# Patient Record
Sex: Male | Born: 1970
Health system: Northeastern US, Community
[De-identification: ages and names within clinical notes are randomized; demographics above are authoritative.]

## PROBLEM LIST (undated history)

## (undated) DIAGNOSIS — E785 Hyperlipidemia, unspecified: Secondary | ICD-10-CM

## (undated) HISTORY — DX: Hyperlipidemia, unspecified: E78.5

---

## 2002-08-07 ENCOUNTER — Encounter: Payer: Self-pay | Admitting: Family Medicine

## 2003-02-26 ENCOUNTER — Ambulatory Visit: Payer: Self-pay | Admitting: Family Medicine

## 2003-02-26 ENCOUNTER — Ambulatory Visit (HOSPITAL_BASED_OUTPATIENT_CLINIC_OR_DEPARTMENT_OTHER): Payer: Self-pay | Admitting: Family Medicine

## 2003-03-26 ENCOUNTER — Ambulatory Visit (HOSPITAL_BASED_OUTPATIENT_CLINIC_OR_DEPARTMENT_OTHER): Payer: Self-pay | Admitting: Family Medicine

## 2003-03-27 ENCOUNTER — Ambulatory Visit: Payer: Self-pay | Admitting: Family Medicine

## 2003-04-16 ENCOUNTER — Ambulatory Visit: Payer: Self-pay | Admitting: Family Medicine

## 2003-04-30 ENCOUNTER — Encounter: Payer: Self-pay | Admitting: Family Medicine

## 2003-05-26 ENCOUNTER — Ambulatory Visit: Payer: Self-pay | Admitting: Family Medicine

## 2003-05-26 LAB — URINALYSIS
BILIRUBIN, URINE: NEGATIVE
GLUCOSE, URINE: NEGATIVE MG/DL
KETONE, URINE: NEGATIVE MG/DL
LEUKOCYTE ESTERASE: NEGATIVE
NITRITE, URINE: NEGATIVE
OCCULT BLOOD, URINE: NEGATIVE
PH URINE: 6.5 (ref 5.0–8.0)
PROTEIN, URINE: NEGATIVE MG/DL
SPECIFIC GRAVITY URINE: 1.01 (ref 1.003–1.035)

## 2003-05-26 LAB — CHG LIPID PANEL
Cholesterol: 240 mg/dl — ABNORMAL HIGH (ref 0–200)
HIGH DENSITY LIPOPROTEIN: 42 mg/dl (ref 35–95)
LOW DENSITY LIPOPROTEIN DIRECT: 185 mg/dl — ABNORMAL HIGH (ref ?–130)
RISK FACTOR: 5.7 — ABNORMAL HIGH (ref ?–5.0)
TRIGLYCERIDES: 87 mg/dl (ref 30–200)

## 2003-05-26 LAB — TRANSFERASE ALANINE AMINO ALT SGPT: ALANINE AMINOTRANSFERASE: 18 IU/L (ref 3–36)

## 2003-05-26 LAB — GLUCOSE RANDOM: Glucose Random: 90 mg/dl (ref 65–160)

## 2003-05-27 LAB — RPR: RPR: NONREACTIVE

## 2003-05-28 ENCOUNTER — Ambulatory Visit: Payer: Self-pay | Admitting: Family Medicine

## 2003-05-28 LAB — HEPATITIS B SURFACE ANTIBODY: HEPATITIS B SURFACE ANTIBODY: NEGATIVE

## 2003-06-01 ENCOUNTER — Ambulatory Visit (HOSPITAL_BASED_OUTPATIENT_CLINIC_OR_DEPARTMENT_OTHER): Payer: Self-pay | Admitting: Otolaryngology

## 2003-06-08 ENCOUNTER — Other Ambulatory Visit: Payer: Self-pay | Admitting: Otolaryngology

## 2003-06-14 LAB — MRI INTERNAL AUDITORY CANAL W & WO CONTRAST

## 2003-07-01 ENCOUNTER — Encounter: Payer: Self-pay | Admitting: Family Medicine

## 2003-07-02 ENCOUNTER — Ambulatory Visit: Payer: Self-pay | Admitting: Family Medicine

## 2003-07-06 ENCOUNTER — Encounter (HOSPITAL_BASED_OUTPATIENT_CLINIC_OR_DEPARTMENT_OTHER): Payer: Self-pay

## 2003-07-06 ENCOUNTER — Ambulatory Visit (HOSPITAL_BASED_OUTPATIENT_CLINIC_OR_DEPARTMENT_OTHER): Payer: Self-pay | Admitting: Otolaryngology

## 2003-07-08 ENCOUNTER — Ambulatory Visit: Payer: Self-pay | Admitting: Family Medicine

## 2003-07-27 ENCOUNTER — Encounter (HOSPITAL_BASED_OUTPATIENT_CLINIC_OR_DEPARTMENT_OTHER): Payer: PRIVATE HEALTH INSURANCE | Admitting: Neurology

## 2003-07-27 LAB — CHG SEDIMENTATION RATE RBC NON-AUTOMATED: RBC SEDIMENTATION RATE: 2 MM/HR (ref 0–10)

## 2003-07-28 LAB — RPR: RPR: NONREACTIVE

## 2003-07-28 LAB — CHG RHEUMATOID FACTOR QUALITATIVE: RHEUMATOID FACTOR: POSITIVE

## 2003-07-30 LAB — LYME ANTIBODY, TOTAL(SENDOUT): LYME ANTIBODY, TOTAL: NEGATIVE

## 2003-08-04 LAB — ANTINUCLEAR ANTIBODY: ANTINUCLEAR ANTIBODY: NEGATIVE

## 2003-08-17 ENCOUNTER — Encounter (HOSPITAL_BASED_OUTPATIENT_CLINIC_OR_DEPARTMENT_OTHER): Payer: Self-pay | Admitting: Neurology

## 2003-08-17 ENCOUNTER — Other Ambulatory Visit (HOSPITAL_BASED_OUTPATIENT_CLINIC_OR_DEPARTMENT_OTHER): Payer: Self-pay | Admitting: Neurology

## 2003-08-17 DIAGNOSIS — G379 Demyelinating disease of central nervous system, unspecified: Secondary | ICD-10-CM

## 2003-08-24 ENCOUNTER — Encounter (HOSPITAL_BASED_OUTPATIENT_CLINIC_OR_DEPARTMENT_OTHER): Payer: PRIVATE HEALTH INSURANCE | Admitting: Neurology

## 2003-08-24 ENCOUNTER — Ambulatory Visit (HOSPITAL_BASED_OUTPATIENT_CLINIC_OR_DEPARTMENT_OTHER): Payer: Self-pay | Admitting: Ophthalmology

## 2003-08-24 LAB — ~~LOC~~ MEDICAL SPECIALITIES

## 2003-08-25 LAB — CHG RHEUMATOID FACTOR QUALITATIVE: RHEUMATOID FACTOR: NEGATIVE

## 2003-08-27 LAB — WHIDDEN-PULMONARY FUNCTION

## 2003-08-28 LAB — WHIDDEN-EEG

## 2003-09-07 ENCOUNTER — Encounter (HOSPITAL_BASED_OUTPATIENT_CLINIC_OR_DEPARTMENT_OTHER): Payer: Self-pay | Admitting: Neurology

## 2003-09-14 ENCOUNTER — Encounter (HOSPITAL_BASED_OUTPATIENT_CLINIC_OR_DEPARTMENT_OTHER): Payer: Self-pay | Admitting: Neurology

## 2003-09-14 LAB — CELL COUNT & DIFFERENTIAL CSF
CSF MONONUCLEATED CELL PERCENT: 0 %
CSF MONONUCLEATED CELL PERCENT: 100 %
CSF OTHER CELLS %: 0 %
CSF OTHER CELLS %: 0 %
CSF POLYNUCLEATED CELL PERCENT: 0 %
CSF POLYNUCLEATED CELL PERCENT: 0 %
CSF RED BLOOD CELLS: 0 /mm3 (ref 0–0)
CSF RED BLOOD CELLS: 1 /mm3 — ABNORMAL HIGH (ref 0–0)
CSF TUBE NUMBER: 1
CSF TUBE NUMBER: 5
CSF WHITE BLOOD CELLS: 0 /mm3 (ref 0–5)
CSF WHITE BLOOD CELLS: 3 /mm3 (ref 0–5)

## 2003-09-14 LAB — CYTOLOGY SPECIMEN

## 2003-09-14 LAB — ~~LOC~~ MEDICAL SPECIALITIES

## 2003-09-14 LAB — GLUCOSE RANDOM: Glucose Random: 94 mg/dl (ref 65–160)

## 2003-09-15 LAB — CRYPTOCOCCAL ANTIGEN, CSF: CRYPTOCOCCAL ANTIGEN CSF: NEGATIVE

## 2003-09-15 LAB — VDRL CSF: VDRL CSF: NONREACTIVE

## 2003-09-21 LAB — CSF CULTURE AND GRAM STAIN: CSF CULTURE: NO GROWTH

## 2003-09-23 LAB — GLUCOSE AND PROTEIN CSF
GLUCOSE CSF: 55 mg/dl
PROTEIN CSF: 27 mg/dl (ref 15–45)

## 2003-09-23 LAB — OLIGOCLONAL IMMUNE: OLIGOCLONAL BANDS, CSF: NEGATIVE

## 2003-09-28 ENCOUNTER — Encounter (HOSPITAL_BASED_OUTPATIENT_CLINIC_OR_DEPARTMENT_OTHER): Payer: Charity | Admitting: Neurology

## 2003-09-28 LAB — ~~LOC~~ MEDICAL SPECIALITIES

## 2003-10-21 LAB — FUNGAL CULTURE

## 2003-11-17 HISTORY — PX: EXCISION/TRANSPOSITION PTERYGIUM W/O GRAFT: OPH372

## 2003-11-20 LAB — TB CULT AND SMEAR (STATE LAB): TB SMEAR STATE LAB: NONE SEEN

## 2003-12-09 LAB — HEMATOCRIT: HEMATOCRIT: 44.9 % (ref 42.0–52.0)

## 2003-12-10 ENCOUNTER — Ambulatory Visit: Payer: Self-pay | Admitting: Family Medicine

## 2003-12-10 ENCOUNTER — Encounter (HOSPITAL_BASED_OUTPATIENT_CLINIC_OR_DEPARTMENT_OTHER): Payer: Self-pay | Admitting: Family Medicine

## 2003-12-21 ENCOUNTER — Encounter (HOSPITAL_BASED_OUTPATIENT_CLINIC_OR_DEPARTMENT_OTHER): Payer: Self-pay | Admitting: Ophthalmology

## 2003-12-22 ENCOUNTER — Ambulatory Visit (HOSPITAL_BASED_OUTPATIENT_CLINIC_OR_DEPARTMENT_OTHER): Payer: Self-pay | Admitting: Ophthalmology

## 2003-12-22 LAB — ~~LOC~~-OPERATIVE REPORT

## 2003-12-25 ENCOUNTER — Ambulatory Visit (HOSPITAL_BASED_OUTPATIENT_CLINIC_OR_DEPARTMENT_OTHER): Payer: Self-pay | Admitting: Family Medicine

## 2003-12-29 ENCOUNTER — Ambulatory Visit (HOSPITAL_BASED_OUTPATIENT_CLINIC_OR_DEPARTMENT_OTHER): Payer: Self-pay | Admitting: Ophthalmology

## 2004-01-18 ENCOUNTER — Ambulatory Visit (HOSPITAL_BASED_OUTPATIENT_CLINIC_OR_DEPARTMENT_OTHER): Payer: Self-pay | Admitting: Ophthalmology

## 2004-01-25 ENCOUNTER — Encounter (HOSPITAL_BASED_OUTPATIENT_CLINIC_OR_DEPARTMENT_OTHER): Payer: Charity | Admitting: Neurology

## 2004-01-25 LAB — ~~LOC~~ MEDICAL SPECIALITIES

## 2006-01-16 ENCOUNTER — Ambulatory Visit (HOSPITAL_BASED_OUTPATIENT_CLINIC_OR_DEPARTMENT_OTHER): Payer: Self-pay | Admitting: Family Medicine

## 2006-01-16 ENCOUNTER — Encounter (HOSPITAL_BASED_OUTPATIENT_CLINIC_OR_DEPARTMENT_OTHER): Payer: Self-pay | Admitting: Family Medicine

## 2006-01-16 VITALS — BP 120/90 | HR 68 | Temp 98.1°F | Resp 18 | Ht 65.0 in | Wt 131.0 lb

## 2006-01-16 DIAGNOSIS — J309 Allergic rhinitis, unspecified: Secondary | ICD-10-CM

## 2006-01-16 DIAGNOSIS — Z823 Family history of stroke: Secondary | ICD-10-CM | POA: Insufficient documentation

## 2006-01-16 DIAGNOSIS — Z0000 Encounter for general adult medical examination without abnormal findings: Secondary | ICD-10-CM

## 2006-01-16 DIAGNOSIS — R51 Headache: Secondary | ICD-10-CM

## 2006-01-16 DIAGNOSIS — R519 Headache, unspecified: Secondary | ICD-10-CM | POA: Insufficient documentation

## 2006-01-16 DIAGNOSIS — E785 Hyperlipidemia, unspecified: Secondary | ICD-10-CM

## 2006-01-16 MED ORDER — LORATADINE 10 MG PO TABS
ORAL_TABLET | ORAL | Status: AC
Start: 2006-01-16 — End: 2007-01-16

## 2006-01-16 NOTE — Progress Notes (Signed)
Patient Active Problem List:   HEADACHE [784.0]   Date Noted: 01/16/2006   Comment: 01/16/2006 left sided, ?migraine type; try    Excedrin Migraine PRN   HYPERLIPIDEMIA NEC/NOS [272.4]   Date Noted: 01/16/2006   Comment: 01/16/2006 Diet info given in appropriate    language; will recheck    ALLERGIC RHINITIS NOS [477.9]   Date Noted: 01/16/2006   Comment: 01/16/2006 sx dry throat, try PRN    loratadine and diet info given for GERD   FAMILY HX STROKE [V17.1]   Date Noted: 01/16/2006   Comment: Father 50  No current outpatient prescriptions on file prior to 01/16/06.    Here to follow up above.  Patient Active Problem List updated at today's visit.      Sx left eye for past 3-4 mos; first gets HA, then gets "lights, then clouding" over left eye. Happens about q 1-2 weeks; tried acetaminophen (Tylenol).  Also sx both legs get numbness after same position for prolonged period; no other neuro sx; The patient is reassured that these symptoms do not appear to represent a serious or threatening condition .  Also sx past yr of throat dryness; increased with soda, ?caffeine.no other ENT/GI sx.    Otherwise Generally feels well; no chest pain, no shortness of breath, no DOE, no fever, no significant GI sx, no focal neuro sx, no significant/worrisome rash, no significant GU sx, no acute or significant musculoskeletal sx    OBJECTIVE:  BP 120/90  Pulse 68  Temp (Src) 98.1 (Oral)  Resp 18  Ht 5\' 5"  (1.80m)  Wt 131 lbs (59.4kg)  Body mass index is 21.80 kg/(m^2).  NAD, alert, well developed, well nourished  Patient appears well. Afebrile. Nose: nasal pallor and congestion noted. Ears are normal. Throat unremarkable except post nasal drip, no exudates. Neck without significant lymphadenopathy. Chest is clear to IPPA.         SUBJECTIVE: 35 year old male for annual routine checkup.   I have fully reviewed the past medical, surgical, social and family history and updated the Histories section of EpicCare.    No current outpatient  prescriptions on file prior to 01/16/06.     Allergies: Review of patient's allergies indicates no known allergies.     ROS: No TIA's or unusual headaches, no dysphagia. No prolonged   cough. No dyspnea or chest pain on exertion. No abdominal pain,   change in bowel habits, black or bloody stools. No urinary tract   symptoms.    OBJECTIVE:   HEAD AND NECK: Ears normal. Throat, oral cavity and tongue normal. Neck supple. No adenopathy or masses in the neck or   supraclavicular regions. No carotid bruits. No thyromegaly.   NEURO: Cranial nerves are normal. Neck supple. DTR's normal and symmetric.   CHEST: Clear, good air entry, no wheezes, rhonci or rales.   HEART: S1 and S2 normal, no murmurs, clicks, gallops or   rubs. Regular rate and rhythm. No edema or JVD.   ABDOMEN: Soft without tenderness, guarding, mass or organomegaly. No CVA tenderness or inguinal adenopathy.   EXTREMITIES: Extremities, reflexes and peripheral pulses are normal.   SKIN: No rashes or suspicious skin lesions noted.   Genitals normal; both testes normal without tenderness, masses, hydroceles, varicoceles, erythema or swelling. Shaft normal, meatus normal without discharge. No inguinal hernia noted. No inguinal lymphadenopathy.    ASSESSMENT:   V70.0 ROUTINE MEDICAL EXAM (primary encounter diagnosis)  Note:   Plan: GLUCOSE FASTING  784.0 HEADACHE  Note: as per Patient Active Problem List; Call or return to our office if these symptoms worsen or fail to improve as expected.   Plan:     272.4 HYPERLIPIDEMIA NEC/NOS  Note: will recheck   Plan: LIPID PANEL, ROUTINE VENIPUNCTURE   counselled regarding need to lower cholesterol to avoid complications such as MI, CVA, and even death.     477.9 ALLERGIC RHINITIS NOS  Note: See Orders and Patient Instructions.   Plan: LORATADINE 10 MG OR TABS       V17.1 FAMILY HX STROKE  Note: counselled regarding risk factors for CAD, including diabetes, hypercholesterolemia, hypertension, smoking, family history,  obesity, sedentary lifestyle   Plan: GLUCOSE FASTING

## 2006-01-16 NOTE — Patient Instructions (Signed)
Dieta, exercicio como falamos.  Tome para dor de cabeca, quando precisar: Excedrin Migraine.  Se continuar com sintomas na garganta, tome loratadine.  Volte para fazer exames de sangue, em jejum (nada de comer ou beber depois da meia noite; mas pode tomar agua e os remedios).

## 2006-01-22 ENCOUNTER — Other Ambulatory Visit (HOSPITAL_BASED_OUTPATIENT_CLINIC_OR_DEPARTMENT_OTHER): Payer: Charity | Admitting: Lab

## 2006-01-22 DIAGNOSIS — E785 Hyperlipidemia, unspecified: Secondary | ICD-10-CM

## 2006-01-22 DIAGNOSIS — Z823 Family history of stroke: Secondary | ICD-10-CM

## 2006-01-22 DIAGNOSIS — Z0000 Encounter for general adult medical examination without abnormal findings: Principal | ICD-10-CM

## 2006-01-22 LAB — CHG LIPID PANEL
Cholesterol: 210 mg/dl — ABNORMAL HIGH (ref 0–200)
HIGH DENSITY LIPOPROTEIN: 37 mg/dl (ref 29–71)
LOW DENSITY LIPOPROTEIN DIRECT: 160 mg/dl — ABNORMAL HIGH (ref 0–100)
RISK FACTOR: 5.7 — ABNORMAL HIGH (ref ?–5.0)
TRIGLYCERIDES: 55 mg/dl (ref 0–150)

## 2006-01-22 LAB — GLUCOSE FASTING: GLUCOSE FASTING: 90 mg/dl (ref 74–118)

## 2006-01-22 NOTE — Nursing Note (Signed)
>>   Jonathan [name], Jonathan [name]     01/22/2006   8:47 am  Labs drawn.

## 2006-03-15 ENCOUNTER — Encounter (HOSPITAL_BASED_OUTPATIENT_CLINIC_OR_DEPARTMENT_OTHER): Payer: Self-pay | Admitting: Family Medicine

## 2006-05-29 ENCOUNTER — Ambulatory Visit (HOSPITAL_BASED_OUTPATIENT_CLINIC_OR_DEPARTMENT_OTHER): Payer: Charity | Admitting: Family Medicine

## 2006-05-29 VITALS — BP 110/74 | HR 80 | Ht 65.0 in | Wt 127.0 lb

## 2006-05-29 DIAGNOSIS — H11009 Unspecified pterygium of unspecified eye: Secondary | ICD-10-CM

## 2006-05-29 DIAGNOSIS — Z823 Family history of stroke: Secondary | ICD-10-CM

## 2006-05-29 DIAGNOSIS — E785 Hyperlipidemia, unspecified: Secondary | ICD-10-CM

## 2006-05-29 MED ORDER — ASPIRIN 81 MG PO TABS
ORAL_TABLET | ORAL | Status: DC
Start: 2006-05-29 — End: 2016-01-17

## 2006-05-29 NOTE — Patient Instructions (Signed)
Dieta, exercicio como falamos.  Consulta com Ophthalmology.  Tome aspirina todos os dias, ou 3-5 dias por semana.  Volte para um exame completo, em jejum (nada de comer ou beber depois da meia noite; mas pode tomar agua e os remedios), em Maio 2008.

## 2006-05-29 NOTE — Progress Notes (Signed)
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    Patient Active Problem List:   HEADACHE [784.0]   Date Noted: 01/16/2006   Comment: 01/16/2006 left sided, ?migraine type; try    Excedrin Migraine PRN   HYPERLIPIDEMIA NEC/NOS [272.4]   Date Noted: 01/16/2006   Comment: 01/16/2006 Diet info given in appropriate    language; will recheck    ALLERGIC RHINITIS NOS [477.9]   Date Noted: 01/16/2006   Comment: 01/16/2006 sx dry throat, try PRN loratadine    and diet info given for GERD   FAMILY HX STROKE [V17.1]   Date Noted: 01/16/2006   Comment: Father 52    Generally feels well; no chest pain, no shortness of breath, no DOE, no fever, no significant GI sx, no focal neuro sx, no significant/worrisome rash, no significant GU sx, no acute or significant musculoskeletal sx    OBJECTIVE:  \BP 110/74  Pulse 80  Ht 5\' 5"  (1.75m)  Wt 127 lbs (57.6kg)  Body mass index is 21.13 kg/(m^2).  Eyes: Pterygium noted bilat; no atypical features noted    ASSESSMENT:  272.4 HYPERLIPIDEMIA NEC/NOS (primary encounter diagnosis)  Note: counselled regarding need to lower cholesterol to avoid complications such as MI, CVA, and even death.; options discussed in detail; will start asa for now  Plan: ASPIRIN 81 MG OR TABS       V17.1 FAMILY HX STROKE  Note: counselled regarding risk factors for CAD and stroke, including diabetes, hypercholesterolemia, hypertension, smoking, family history, obesity, sedentary lifestyle   Plan: ASPIRIN 81 MG OR TABS       372.40 PTERYGIUM NOS  Note: See Orders and Patient Instructions.   Plan: REFERRAL TO OPHTHALMOLOGY (INT)

## 2006-07-12 ENCOUNTER — Other Ambulatory Visit: Payer: Charity | Admitting: Ophthalmology

## 2006-11-07 ENCOUNTER — Ambulatory Visit (HOSPITAL_BASED_OUTPATIENT_CLINIC_OR_DEPARTMENT_OTHER): Payer: No Typology Code available for payment source

## 2006-11-07 VITALS — BP 118/80 | HR 74 | Temp 98.3°F | Ht 65.5 in | Wt 128.1 lb

## 2006-11-07 DIAGNOSIS — R51 Headache: Secondary | ICD-10-CM

## 2006-11-07 MED ORDER — NAPROXEN 375 MG PO TABS
ORAL_TABLET | ORAL | Status: AC
Start: 2006-11-07 — End: 2006-12-06

## 2006-11-07 MED ORDER — MULTIVITAMINS FC OR TABS
ORAL_TABLET | ORAL | Status: AC
Start: 2006-11-07 — End: 2006-12-06

## 2006-11-07 NOTE — Patient Instructions (Signed)
4 weeks follow up phyical with PCP.

## 2006-11-07 NOTE — Progress Notes (Signed)
SUBJECTIVE:  Jonathan [name] is a 36 year old male who complains of headaches for 4 days. Description of pain: bilateral in the temporal area. Duration of individual headaches: 3 hours, frequency rare.   Associated symptoms: muscle aches and tightness. No assoc. Photo/phonophobia, n/v.   Pain relief adequate with two acetaminophen. Precipitating factors: stress and work. He denies a history of recent head injury.   Prior neurological history: negative for no neurological problems.  Neurologic Review of Systems - no TIA or stroke-like symptoms, no amaurosis, diplopia, abnormal speech, unilateral numbness or weakness, stable neurological symptoms unchanged from prior visits.    States that with elevated cholesterol, he would like to exercise but has not started. He has been working and coming home to sleep. Eating better though.     Patient Active Problem List:   HEADACHE [784.0]   Date Noted: 01/16/2006   Comment: 01/16/2006 left sided, ?migraine type; try    Excedrin Migraine PRN   HYPERLIPIDEMIA NEC/NOS [272.4]   Date Noted: 01/16/2006   Comment: 01/16/2006 Diet info given in appropriate    language; will recheck    ALLERGIC RHINITIS NOS [477.9]   Date Noted: 01/16/2006   Comment: 01/16/2006 sx dry throat, try PRN loratadine    and diet info given for GERD   FAMILY HX STROKE [V17.1]   Date Noted: 01/16/2006   Comment: Father 45      Current outpatient prescriptions:  NAPROXEN 375 MG OR TABS, 1 TABLET TWICE DAILY, Disp: 30, Rfl: 0  MULTIVITAMINS FC OR TABS, 1 TABLET DAILY, Disp: 30, Rfl: 3  ASPIRIN 81 MG OR TABS, OVER THE COUNTER; one a day, after meal, Disp: 100, Rfl: 3  LORATADINE 10 MG OR TABS, OVER THE COUNTER; TAKE 1 TABLET DAILY FOR ALLERGY SYMPTOMS, Disp: 30, Rfl: 10      OBJECTIVE:  BP 118/80   Pulse 74   Temp 98.3   Ht 5' 5.5" (1.81m)   Wt 128 lbs 2.0 oz (58.1kg)  Appearance: healthy, alert and cooperative.  Gen: AAO x 3 in NAD  HEENT: NCAT, Perrl, Eomi, Fundi visualized and benign bilateral. Left small pterygium  noted. No sinus tenderness.  Ears: external canal normal. TMS within normal, no erythema or dullness. Nasal turbinates within normal.   Neck supple: no posterior or anterior cervical chain adenopathy.   Bilateral trapezius: muscle spasm palpable  Thyroid: normal size, no nodularity, nontender.  Heart: regular rate and rhythm. No murmurs ascultated  Lungs: Clear to ascultation bilateral. No wheezes, rales or rhonchi.   Abdomen: soft, nontender, nondistended. Good bowel sounds  Ext: No rashes. No edema. Peripheral pulses strong 2+. Warm and dry  Neurological Exam: normal without focal findings, mental status, speech normal, alert and oriented x iii, PERLA, reflexes normal and symmetric.    ASSESSMENT:  tension headaches.    PLAN:  Headache today not consistent with migraine. Most stress, work related and bilateral temples which resolves with tylenol. Asked to keep headache diary and patient reassured that neurodiagnostic workup not indicated from benign H&P.  See orders in EpicCare.

## 2006-11-14 ENCOUNTER — Encounter (HOSPITAL_BASED_OUTPATIENT_CLINIC_OR_DEPARTMENT_OTHER): Payer: Self-pay

## 2006-11-14 DIAGNOSIS — H5210 Myopia, unspecified eye: Principal | ICD-10-CM

## 2006-12-20 ENCOUNTER — Ambulatory Visit (HOSPITAL_BASED_OUTPATIENT_CLINIC_OR_DEPARTMENT_OTHER): Payer: No Typology Code available for payment source | Admitting: Family Medicine

## 2006-12-20 VITALS — BP 102/74 | Temp 97.9°F | Ht 65.5 in | Wt 126.4 lb

## 2006-12-20 DIAGNOSIS — K5900 Constipation, unspecified: Secondary | ICD-10-CM

## 2006-12-20 DIAGNOSIS — E785 Hyperlipidemia, unspecified: Secondary | ICD-10-CM

## 2006-12-20 DIAGNOSIS — R51 Headache: Secondary | ICD-10-CM

## 2006-12-20 LAB — CHG LIPID PANEL
Cholesterol: 228 mg/dl — ABNORMAL HIGH (ref 0–200)
HIGH DENSITY LIPOPROTEIN: 37 mg/dl (ref 29–71)
LOW DENSITY LIPOPROTEIN DIRECT: 168 mg/dl — ABNORMAL HIGH (ref 0–100)
RISK FACTOR: 6.2 — ABNORMAL HIGH (ref ?–5.0)
TRIGLYCERIDES: 82 mg/dl (ref 0–150)

## 2006-12-20 NOTE — Progress Notes (Signed)
Patient Active Problem List   ALLERGIC RHINITIS NOS [477.9]   Priority: High [1]   Date Noted: 01/16/2006   01/16/2006 sx dry throat, try PRN loratadine and diet    info given for GERD   HEADACHE [784.0]   Date Noted: 01/16/2006   01/16/2006 left sided, ?migraine type; try Excedrin    Migraine PRN; 12/20/2006 seen again for headache 2/08,    sx greatly improved with new glasses   HYPERLIPIDEMIA NEC/NOS [272.4]   Date Noted: 01/16/2006   01/16/2006 Diet info given in appropriate language;    12/20/2006 recheck    FAMILY HX STROKE [V17.1]   Date Noted: 01/16/2006   Father 48; 12/20/2006 pt has been taking ASA    Current outpatient prescriptions prior to encounter:  ASPIRIN 81 MG OR TABS, OVER THE COUNTER; one a day, after meal, Disp: 100, Rfl: 3  LORATADINE 10 MG OR TABS, OVER THE COUNTER; TAKE 1 TABLET DAILY FOR ALLERGY SYMPTOMS, Disp: 30, Rfl: 10      Here to follow up above.  Patient Active Problem List updated at today's visit.  Medications reviewed in detail with patient; reports compliance with meds, unless noted otherwise below, and no side effects noted.    Last seen 2/20 for headache; since then has seen eye doc, new glasses and now sx better but still occasionally headache, though not as frequent or as severe.  Has made TLC to decrease cholesterol; wants to recheck.  Also, has been having sx constipation; no tx or diet changes made to address this; no other gi sx    Otherwise, except as noted above, Generally feels well; no chest pain, no shortness of breath, no DOE, no fever, no significant GI sx, no focal neuro sx, no significant/worrisome rash, no significant GU sx, no acute or significant musculoskeletal sx      OBJECTIVE:  BP 102/74   Temp (Src) 97.9 F (36.6 C) (Oral)   Ht 5' 5.5" (1.664 m)   Wt 126 lb 6 oz (57.323 kg)  Body mass index is 20.71 kg/(m^2).  NAD, alert, well developed, well nourished  Mental status exam; alert, oriented. Normal thought content, speech, and dress are noted. Denies suicidal or  homicidal ideation. Affect and mood appropriate  Neuro: grossly nonfocal.  The neck is supple and free of adenopathy or masses, the thyroid is normal without enlargement or nodules.  PHQ-9 SCORE: 7      ASSESSMENT:  784.0 Headache (primary encounter diagnosis)  Comment: The patient is reassured that these symptoms do not appear to represent a serious or threatening condition; Follow up at next/future visit   Plan:     272.4 HYPERLIPIDEMIA NEC/NOS  Comment: recheck   Plan: LIPID PANEL, ROUTINE VENIPUNCTURE       564.00A Constipation  Comment: symptomatic tx; See Orders and Patient Instructions.   Plan: DOCUSATE CALCIUM 240 MG OR CAPS

## 2006-12-20 NOTE — Patient Instructions (Signed)
[address], mais [place].  Tome o docusate.  Eu depois mando-lhe uma carta sobre os [employer]

## 2007-04-29 ENCOUNTER — Encounter (HOSPITAL_BASED_OUTPATIENT_CLINIC_OR_DEPARTMENT_OTHER): Payer: Self-pay | Admitting: Family Medicine

## 2007-10-21 ENCOUNTER — Ambulatory Visit (HOSPITAL_BASED_OUTPATIENT_CLINIC_OR_DEPARTMENT_OTHER): Payer: No Typology Code available for payment source | Admitting: Ophthalmology

## 2007-11-11 ENCOUNTER — Ambulatory Visit (HOSPITAL_BASED_OUTPATIENT_CLINIC_OR_DEPARTMENT_OTHER): Payer: No Typology Code available for payment source

## 2007-11-11 ENCOUNTER — Encounter (HOSPITAL_BASED_OUTPATIENT_CLINIC_OR_DEPARTMENT_OTHER): Payer: Self-pay

## 2007-11-11 VITALS — BP 120/70 | Temp 98.1°F | Ht 65.0 in | Wt 140.0 lb

## 2007-11-11 DIAGNOSIS — R51 Headache: Secondary | ICD-10-CM

## 2007-11-11 DIAGNOSIS — Z0000 Encounter for general adult medical examination without abnormal findings: Secondary | ICD-10-CM

## 2007-11-11 DIAGNOSIS — E785 Hyperlipidemia, unspecified: Secondary | ICD-10-CM

## 2007-11-11 MED ORDER — IBUPROFEN 600 MG PO TABS
ORAL_TABLET | ORAL | Status: AC
Start: 2007-11-11 — End: 2007-12-11

## 2007-11-12 NOTE — Progress Notes (Signed)
Chief Complaint:   Jonathan [name] is a 37 year old male who presents for  Annual physical    History of Present Illness:    Pt reports that overall doing well. He has not adhered to advice of previous pcp to improve diet and exercise to improve cholesterol profile.  He c/o headache that is bilateral temporal, once a month, without radiation or aura/photo or phonophobia. He reports it occurs due to stress.  Denies any sexual activity.      Past Medical History    Hyperlipidemia            Past Surgical History    REMOVAL OF EYE LESION 3/05    Comment: Dr Nathaneil Canary           Current outpatient prescriptions prior to encounter:  IBUPROFEN 600 MG OR TABS 1 TABLET EVERY 8 HOURS as needed for headache Disp: 120 Rfl: 0   DOCUSATE CALCIUM 240 MG OR CAPS OVER THE COUNTER:  1 CAPSULE AT BEDTIME AS NEEDED FOR CONSTIPATION Disp: 100 Rfl: 3   NAPROXEN 375 MG OR TABS 1 TABLET TWICE DAILY Disp: 30 Rfl: 0   MULTIVITAMINS FC OR TABS 1 TABLET DAILY Disp: 30 Rfl: 3   ASPIRIN 81 MG OR TABS OVER THE COUNTER; one a day, after meal Disp: 100 Rfl: 3         Review of Patient's Allergies indicates:  No Known Allergies.      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, family     Social History Main Topics   Tobacco Use: Never    Alcohol Use: No    Drug Use: No    Sexually Active: No    Comment: not sexually active     Other Topics Concern   None     Social History Narrative    Working: Investment banker, operational         Family History    Stroke Father    Comment: 44    GI Mother    Comment: died 72 after ?GI surgery                             Review Of Systems  Skin: negative  Eyes: negative  Ears/Nose/Throat: negative  Respiratory: negative  Cardiovascular: negative  Gastrointestinal: negative  Genitourinary: negative  Musculoskeletal: negative  Neurologic: negative  Psychiatric: negative  Hematologic/Lymphatic/Immunologic:  negative  Endocrine: negative    PHYSICAL EXAMINATION:  BP 120/70   Temp (Src) 98.1 F (36.7 C) (Temporal)   Ht 5\' 5"  (1.651 m)   Wt 140 lb (63.504 kg)  General appearance - healthy, alert, well developed, well nourished  Skin - skin color, texture, turgor are normal  Head - Normocephalic. No masses, lesions, tenderness or abnormalities  Eyes - conjunctivae/corneas clear. PERRL, EOM's intact. Fundi benign  Ears - External ears normal. Canals clear. TM's normal.  Nose/Sinuses - Nares normal. Septum midline. Mucosa normal. No drainage or sinus tenderness.  Oropharynx - Lips, mucosa, and tongue normal. Teeth and gums normal. Oropharynx moist and without lesion  Neck - Neck supple. No adenopathy. Thyroid symmetric, normal size, and without nodularity  Back - Back symmetric, no curvature. ROM normal. No CVA tenderness.  Lungs - Percussion normal. Good diaphragmatic excursion. Lungs clear to auscultation  bilaterally  Heart - PMI normal. No lifts, heaves, or thrills. RRR. No murmurs, clicks, gallops or rubs  Abdomen - Abdomen soft, non-tender. BS normal. No masses, no organomegaly  Extremities - Extremities normal. No deformities, edema, or skin discoloration  Musculoskeletal - Spine ROM normal. Muscular strength intact.  Peripheral pulses - radial=4/4, femoral=4/4, popliteal=4/4, dorsalis pedis=4/4  Neuro - Gait normal.    ASSESSMENT/PLAN:  V70.0 Routine General Medical Examination at a Health Care Facility  (primary encounter diagnosis)  Plan:up to date with immunizations    272.4 HYPERLIPIDEMIA NEC/NOS  Discussed diet and exercise. Pt to start omega 3 fatty acids 900mg  daily supplement. Also appt with nutritionist.  Plan: LIPID PANEL            V77.1A Screening, Diabetes Mellitus    Plan: GLUCOSE FASTING       784.0 Headache  Tension headache.  Plan: IBUPROFEN 600 MG OR TABS

## 2007-11-25 ENCOUNTER — Ambulatory Visit (HOSPITAL_BASED_OUTPATIENT_CLINIC_OR_DEPARTMENT_OTHER): Payer: No Typology Code available for payment source | Admitting: Lab

## 2007-11-25 DIAGNOSIS — E785 Hyperlipidemia, unspecified: Principal | ICD-10-CM

## 2007-11-25 LAB — CHG LIPID PANEL
Cholesterol: 206 mg/dl — ABNORMAL HIGH (ref 0–200)
HIGH DENSITY LIPOPROTEIN: 31 mg/dl (ref 29–71)
LOW DENSITY LIPOPROTEIN DIRECT: 139 mg/dl — ABNORMAL HIGH (ref 0–100)
RISK FACTOR: 6.7 — ABNORMAL HIGH (ref ?–5.0)
TRIGLYCERIDES: 142 mg/dl (ref 0–150)

## 2007-11-25 LAB — GLUCOSE FASTING: GLUCOSE FASTING: 96 mg/dl (ref 74–118)

## 2007-11-25 NOTE — Progress Notes (Signed)
Labs drawn

## 2007-11-27 ENCOUNTER — Encounter (HOSPITAL_BASED_OUTPATIENT_CLINIC_OR_DEPARTMENT_OTHER): Payer: Self-pay

## 2008-01-27 ENCOUNTER — Ambulatory Visit (HOSPITAL_BASED_OUTPATIENT_CLINIC_OR_DEPARTMENT_OTHER): Payer: No Typology Code available for payment source

## 2008-01-27 VITALS — BP 110/70 | Temp 98.6°F | Ht 65.0 in | Wt 137.0 lb

## 2008-01-27 DIAGNOSIS — R51 Headache: Secondary | ICD-10-CM

## 2008-01-27 NOTE — Progress Notes (Signed)
Cc: persistent headaches    SUBJECTIVE:  37 yo male c/o daily headache. Awakes in the morning every morning with a headache for the past two weeks. Reports lots of stress at work. Sleeps 6-7 hours every night and reports sleeping well. Works as a Investment banker, operational and 8 hour shifts.  Headache located base of scalp.  No radiation to temples, behind eyes. Not associated with lacrimation or rhinorrhea.   Not associated with nausea, vomitting, photo or phonophobia.   Takes ibuprofen 600mg  with total relief but affecting stomach/ gerd like symptoms. Takes approx 3 x per week. Otherwise takes tylenol with moderate relief.     Hx of abnl MRI in 2004. White matter plaques worked up by neurologist extensively including CSF. Most likely normal findings.      Past Medical History    Hyperlipidemia        Patient Active Problem List    HEADACHE [784.0]         Date Noted: 01/16/2006         01/16/2006 left sided, ?migraine type; try Excedrin          Migraine PRN; 12/20/2006 seen again for headache 2/08,          sx greatly improved with new glasses         10/2007 reports headache only minimally resolves with          tylenol or excedrin. Would like to try something          else.    HYPERLIPIDEMIA NEC/NOS [272.4]         Date Noted: 01/16/2006         01/16/2006 Diet info given in appropriate language;          12/20/2006  recheck          10/2007: recheck lipids but admits to high          fatty/fried food diet. Start omega 3 FA daily. Appt          with Edson Snowball, nutritionist.    ALLERGIC RHINITIS NOS [477.9]         Date Noted: 01/16/2006         01/16/2006 sx dry throat, try PRN loratadine and diet          info given for GERD         10/2007 No longer having symptoms.    FAMILY HX STROKE [V17.1]         Date Noted: 01/16/2006         Father 66; 12/20/2006 pt has been taking ASA      Past Surgical History    EXC/TRPOS PTERYGIUM W/O GRF 3/05    Comment: Dr Nathaneil Canary         Current outpatient prescriptions:  IBUPROFEN 600 MG OR TABS 1 TABLET EVERY 8  HOURS as needed for headache Disp: 120 Rfl: 0   DOCUSATE CALCIUM 240 MG OR CAPS OVER THE COUNTER:  1 CAPSULE AT BEDTIME AS NEEDED FOR CONSTIPATION Disp: 100 Rfl: 3   NAPROXEN 375 MG OR TABS 1 TABLET TWICE DAILY Disp: 30 Rfl: 0   MULTIVITAMINS FC OR TABS 1 TABLET DAILY Disp: 30 Rfl: 3   ASPIRIN 81 MG OR TABS OVER THE COUNTER; one a day, after meal Disp: 100 Rfl: 3       Review of Patient's Allergies indicates:  No Known Allergies.    Social History    Marital Status: Single  Spouse Name:                       Years of Education:                 Number of children:               Occupational History  Occupation          Loss adjuster, chartered                                    In [place] 2002; lives w/                                          3 roommates, family    Social History Main Topics    Tobacco Use: Never           Alcohol Use: No              Drug Use: No              Sexual Activity: No                  Comment: not sexually active    Social History Narrative    Working: Investment banker, operational        Family History    Stroke Father    Comment: 41    GI Mother    Comment: died 47 after ?GI surgery       BP 110/70   Temp (Src) 98.6 F (37 C) (Temporal)   Ht 5\' 5"  (1.651 m)   Wt 137 lb (62.143 kg)  Gen: AAO x 3 in NAD  HEENT: NCAT, Perrl, Eomi, Ears: external canal normal. TMS within normal, no erythema or dullness. Nasal turbinates within normal. Neck supply, no posterior or cervical adenopathy.   Thyroid: normal size, no nodularity, nontender.  Heart: regular rate and rhythm. No murmurs ascultated  Lungs: Clear to ascultation bilateral. No wheezes, rales or rhonchi.   Abdomen: soft, nontender, nondistended. Good bowel sounds  Ext:No edema. Peripheral pulses strong 2+. Warm and dry  Skin: no rashes    A/p 37 yo male presents with headache. Already has had extensive neurologic work up in 2004. Persistent daily morning headache, most likely, due to the stress since significant part of the history  today.   Been 5 years since last MRi.  Will recheck  Con't ibuprofen PRN.

## 2008-02-14 ENCOUNTER — Ambulatory Visit: Payer: Self-pay

## 2008-03-13 ENCOUNTER — Encounter (HOSPITAL_BASED_OUTPATIENT_CLINIC_OR_DEPARTMENT_OTHER): Payer: Self-pay

## 2008-11-23 ENCOUNTER — Encounter (HOSPITAL_BASED_OUTPATIENT_CLINIC_OR_DEPARTMENT_OTHER): Payer: Self-pay

## 2008-11-23 ENCOUNTER — Ambulatory Visit (HOSPITAL_BASED_OUTPATIENT_CLINIC_OR_DEPARTMENT_OTHER): Payer: No Typology Code available for payment source

## 2008-11-23 VITALS — BP 118/80 | HR 80 | Temp 98.6°F | Ht 65.0 in | Wt 136.0 lb

## 2008-11-23 DIAGNOSIS — Z0000 Encounter for general adult medical examination without abnormal findings: Secondary | ICD-10-CM

## 2008-11-23 DIAGNOSIS — R51 Headache: Secondary | ICD-10-CM

## 2008-11-23 DIAGNOSIS — E785 Hyperlipidemia, unspecified: Secondary | ICD-10-CM

## 2008-11-23 LAB — CHOLESTEROL SERUM/WHOLE BLOOD TOTAL: Cholesterol: 200 mg/dl (ref 0–200)

## 2008-11-23 LAB — GLUCOSE FASTING: GLUCOSE FASTING: 79 mg/dl (ref 74–118)

## 2008-11-23 LAB — CHG LIPOPROTEIN DIRECT MEASUREMENT LDL CHOLESTEROL: LOW DENSITY LIPOPROTEIN DIRECT: 149 mg/dl — ABNORMAL HIGH (ref 0–100)

## 2008-11-23 LAB — CHG LIPOPROTEIN DIR MEAS HIGH DENSITY CHOLESTEROL: HIGH DENSITY LIPOPROTEIN: 31 mg/dl (ref 29–71)

## 2008-11-23 MED ORDER — FLUOXETINE HCL 10 MG PO TABS
ORAL_TABLET | ORAL | Status: AC
Start: 2008-11-23 — End: 2009-02-23

## 2008-11-23 NOTE — Progress Notes (Signed)
SUBJECTIVE: 38 year old male for annual routine checkup.   I have fully reviewed the past medical, surgical, social and family history and updated the Histories section of EpicCare.  1. Headaches. Pt here today because feels that headaches are very stress related. Stress and lack of sleep. 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 Said its a dull generalized headache. Sometimes just neck tension in muscles rather than headache. Relieved by motrin and tylenol but happy to try something that does not hurt his stomach.  Reviewed mri report with pt.      Current outpatient prescriptions prior to encounter:  FLUOXETINE HCL 10 MG OR TABS 1 TABLET EVERY MORNING Disp: 30 Rfl: 3   IBUPROFEN 600 MG OR TABS 1 TABLET EVERY 8 HOURS as needed for headache Disp: 120 Rfl: 0   DOCUSATE CALCIUM 240 MG OR CAPS OVER THE COUNTER:  1 CAPSULE AT BEDTIME AS NEEDED FOR CONSTIPATION Disp: 100 Rfl: 3   NAPROXEN 375 MG OR TABS 1 TABLET TWICE DAILY Disp: 30 Rfl: 0   MULTIVITAMINS FC OR TABS 1 TABLET DAILY Disp: 30 Rfl: 3   ASPIRIN 81 MG OR TABS OVER THE COUNTER; one a day, after meal Disp: 100 Rfl: 3         Allergies: Review of patient's allergies indicates no known allergies.     ROS:  No TIA's or unusual headaches, no dysphagia. No prolonged   cough. No dyspnea or chest pain on exertion.  No abdominal pain,   change in bowel habits, black or bloody stools.  No urinary tract   symptoms.    OBJECTIVE:   BP 118/80   Pulse 80   Temp (Src) 98.6 F (37 C) (Temporal)   Ht 5\' 5"  (1.651 m)   Wt 136 lb (61.689 kg)   SpO2 98%  HEAD AND NECK:  Ears normal.  Throat, oral cavity and tongue normal.  Neck supple. No adenopathy or masses in the neck or   supraclavicular regions.  No carotid bruits. No thyromegaly.   NEURO: Cranial nerves and fundi are normal. Neck supple. DTR's normal and symmetric.    CHEST:  Clear, good air entry, no wheezes, rhonci or rales.   HEART:  S1 and S2 normal, no murmurs, clicks, gallops or   rubs.   Regular rate and rhythm.  No edema or JVD.   ABDOMEN:  Soft without tenderness, guarding, mass or organomegaly. No CVA tenderness or inguinal adenopathy.   EXTREMITIES:  Extremities, reflexes and peripheral pulses are normal.    SKIN:  No rashes or suspicious skin lesions noted.   Rectal/ genital: deferred    ASSESSMENT: Healthy adult male. See visit diagnoses in EpicCare.    V70.0 Routine General Medical Examination at a Health Care Facility  (primary encounter diagnosis)  Plan: diet/ exercise/ sleep discussed    272.4 HYPERLIPIDEMIA NEC/NOS  Comment:   Plan: ASSAY, BLD/SERUM CHOLESTEROL, ASSAY OF         LIPOPROTEIN (HDL), ASSAY OF BLOOD LIPOPROTEIN         (LDL)            V77.1A Screening, Diabetes Mellitus  Comment:   Plan: GLUCOSE FASTING            784.0 Headache  Comment: reports lots of stress and anxiety/ not sleeping well/ will trial low dose SSRI to help with overall anxiety symptoms that provoke headaches. Also advised buying otc excedrin rather than motrin  since easier on stomac  Plan: FLUOXETINE HCL 10 MG OR TABS        F/u 2 weeks

## 2008-12-14 ENCOUNTER — Telehealth (HOSPITAL_BASED_OUTPATIENT_CLINIC_OR_DEPARTMENT_OTHER): Payer: Self-pay | Admitting: Family Medicine

## 2008-12-14 NOTE — Telephone Encounter (Signed)
Message left requesting a call back to dicuss recent  SSRI start  And headache.

## 2008-12-23 ENCOUNTER — Telehealth (HOSPITAL_BASED_OUTPATIENT_CLINIC_OR_DEPARTMENT_OTHER): Payer: Self-pay | Admitting: Family Medicine

## 2008-12-23 NOTE — Telephone Encounter (Addendum)
Second t/c to pt, no answer, message left requesting pt. to call back. R/T depression outreach

## 2008-12-30 ENCOUNTER — Telehealth (HOSPITAL_BASED_OUTPATIENT_CLINIC_OR_DEPARTMENT_OTHER): Payer: Self-pay | Admitting: Family Medicine

## 2008-12-30 NOTE — Telephone Encounter (Signed)
T/Cto pt. I spoke with his roommate who said Kyshaun had just left for work and that I should call his cell phone. I called his cell no answer. Message left requesting a call back and requesting pt. To sched a f/u appt  With PCP. I have put him on the pending appt list.

## 2009-01-05 ENCOUNTER — Ambulatory Visit (HOSPITAL_BASED_OUTPATIENT_CLINIC_OR_DEPARTMENT_OTHER): Payer: No Typology Code available for payment source

## 2009-01-05 VITALS — BP 102/54 | HR 76 | Temp 98.8°F | Ht 65.0 in | Wt 143.0 lb

## 2009-01-05 DIAGNOSIS — F430 Acute stress reaction: Principal | ICD-10-CM

## 2009-01-05 NOTE — Progress Notes (Signed)
.  .  .        CC: headache/ stress follow up    SUBJECTIVE:  38 year old male was undergoing a lot of stress at work. Stress was provoking headaches.  Was started on SSRI and noticed improvement of frequency of headaches. Was feeling better and sleeping better.  Now quit job and will be starting a new one. Since he has not been working, stopped taking fluoxetine.     No depressive symptoms. Feels overall well    BP 102/54   Pulse 76   Temp (Src) 98.8 F (37.1 C) (Temporal)   Ht 5\' 5"  (1.651 m)   Wt 143 lb (64.864 kg)  Gen awake pleasant and happy appearing talkative male    308.9Y Acute Stress Disorder  (primary encounter diagnosis)  Comment: improvement with SSRI/ stopped taking at this time. Pt may want to resume when starts new job.   Plan: con't current tx and pt to return if having any other problems.  Reviewed blood tests with patient

## 2009-01-14 ENCOUNTER — Encounter (HOSPITAL_BASED_OUTPATIENT_CLINIC_OR_DEPARTMENT_OTHER): Payer: Self-pay

## 2009-06-30 ENCOUNTER — Encounter (HOSPITAL_BASED_OUTPATIENT_CLINIC_OR_DEPARTMENT_OTHER): Payer: Self-pay

## 2009-06-30 ENCOUNTER — Ambulatory Visit (HOSPITAL_BASED_OUTPATIENT_CLINIC_OR_DEPARTMENT_OTHER): Payer: No Typology Code available for payment source

## 2009-06-30 VITALS — BP 110/82 | Temp 98.5°F | Ht 65.0 in | Wt 132.0 lb

## 2009-06-30 DIAGNOSIS — B349 Viral infection, unspecified: Secondary | ICD-10-CM

## 2009-06-30 DIAGNOSIS — G8929 Other chronic pain: Secondary | ICD-10-CM

## 2009-06-30 DIAGNOSIS — R51 Headache: Principal | ICD-10-CM

## 2009-06-30 MED ORDER — RIBOFLAVIN 400 MG PO CAPS
ORAL_CAPSULE | ORAL | Status: AC
Start: 2009-06-30 — End: 2009-12-29

## 2009-06-30 NOTE — Progress Notes (Signed)
SUBJECTIVE:  Jonathan [name] is a 38 year old male who complains of headaches for 3 days. Description of pain: bilateral in the occipital area. Duration of individual headaches: 3 days, frequency. Associated symptoms: nausea and photophobia. Pain relief: unable to obtain relief with OTC meds and Rx Meds - NSAIDS. Precipitating factors: stress. Did a trial of fluoxetine without relief. He denies a history of recent head injury.   Prior neurological history: negative for stroke, multiple sclerosis.  Neurologic Review of Systems - no TIA or stroke-like symptoms, no amaurosis, diplopia, abnormal speech, unilateral numbness or weakness.    Seen by neurology in the past due to changes in MRI. Had LP etc. Doing well overall.     Also complains of mild sore throat. No cough, fevers, chills, sneezing. No muscle aches.      Current outpatient prescriptions:  DOCUSATE CALCIUM 240 MG OR CAPS OVER THE COUNTER:  1 CAPSULE AT BEDTIME AS NEEDED FOR CONSTIPATION Disp: 100 Rfl: 3   NAPROXEN 375 MG OR TABS 1 TABLET TWICE DAILY Disp: 30 Rfl: 0   MULTIVITAMINS FC OR TABS 1 TABLET DAILY Disp: 30 Rfl: 3   ASPIRIN 81 MG OR TABS OVER THE COUNTER; one a day, after meal Disp: 100 Rfl: 3         OBJECTIVE:  BP 110/82   Temp(Src) 98.5 F (36.9 C) (Temporal)   Ht 5\' 5"  (1.651 m)   Wt 132 lb (59.875 kg)  Appearance: healthy, alert and cooperative.  Neurological Exam: normal without focal findings, mental status, speech normal, alert and oriented x iii, PERLA, reflexes normal and symmetric.    A/p  784.0FV Chronic headaches  (primary encounter diagnosis)  Comment: start riboflavin daily  Plan: REFERRAL TO NEUROLOGY (INT)            079.99BF Viral illness  Comment:   Plan: advised hydration, rest and f/u if not better.

## 2009-08-17 ENCOUNTER — Ambulatory Visit (HOSPITAL_BASED_OUTPATIENT_CLINIC_OR_DEPARTMENT_OTHER): Payer: No Typology Code available for payment source | Admitting: Neurology

## 2009-08-17 ENCOUNTER — Ambulatory Visit: Payer: Self-pay

## 2009-08-17 VITALS — BP 100/52 | HR 92 | Resp 16 | Wt 132.0 lb

## 2009-08-17 DIAGNOSIS — R51 Headache: Principal | ICD-10-CM

## 2009-08-17 LAB — CHG SEDIMENTATION RATE RBC NON-AUTOMATED: RBC SEDIMENTATION RATE: 3 MM/HR (ref 0–10)

## 2009-08-17 MED ORDER — AMITRIPTYLINE HCL 25 MG PO TABS
ORAL_TABLET | ORAL | Status: AC
Start: 2009-08-17 — End: 2010-08-17

## 2009-08-17 NOTE — Progress Notes (Signed)
This office note has been dictated. Account number [id number]

## 2009-08-17 NOTE — Progress Notes (Signed)
Patient here for follow up appointment for headaches.Patient states that through the interpreter he had an MRI done a year ago this past June and they thought they saw something.Patient was having problems with his eyes but noticed a change when he got glasses in   2007 which did help.Patient states that he had such a severe headache last Friday he needed to leave work.  Patient also admits that he is safe at home.

## 2009-08-19 LAB — XR CERVICAL SPINE WITH OBLIQUES 5 VIEWS

## 2009-08-23 LAB — MEDICAL SPECIALTIES LETTER

## 2009-08-31 LAB — MRI BRAIN WO CONTRAST

## 2009-09-28 ENCOUNTER — Ambulatory Visit (HOSPITAL_BASED_OUTPATIENT_CLINIC_OR_DEPARTMENT_OTHER): Payer: No Typology Code available for payment source | Admitting: Neurology

## 2009-09-28 VITALS — BP 90/52 | HR 83 | Resp 16 | Wt 140.6 lb

## 2009-09-28 DIAGNOSIS — R51 Headache: Principal | ICD-10-CM

## 2009-09-28 NOTE — Progress Notes (Signed)
This office note has been dictated. Account number [id number]

## 2009-09-28 NOTE — Progress Notes (Signed)
Patient here for follow up appointment for back pain. Patient has been complaining of headaches which is usually on a daily basis PL 5/10.  Patient states that he is safe at home.

## 2009-10-05 LAB — MEDICAL SPECIALTIES LETTER

## 2009-11-02 NOTE — Progress Notes (Addendum)
Quick Note:    Please send patient a letter informing patient that these results are unremarkable. Thank you.    --Anzel Kearse, M.D.  ______

## 2009-12-21 ENCOUNTER — Ambulatory Visit (HOSPITAL_BASED_OUTPATIENT_CLINIC_OR_DEPARTMENT_OTHER): Payer: No Typology Code available for payment source | Admitting: Neurology

## 2010-03-02 ENCOUNTER — Encounter (HOSPITAL_BASED_OUTPATIENT_CLINIC_OR_DEPARTMENT_OTHER): Payer: Self-pay

## 2010-03-02 ENCOUNTER — Ambulatory Visit (HOSPITAL_BASED_OUTPATIENT_CLINIC_OR_DEPARTMENT_OTHER): Payer: No Typology Code available for payment source

## 2010-03-02 VITALS — BP 120/78 | HR 84 | Temp 98.0°F | Ht 65.0 in | Wt 134.0 lb

## 2010-03-02 DIAGNOSIS — Z13220 Encounter for screening for lipoid disorders: Secondary | ICD-10-CM

## 2010-03-02 DIAGNOSIS — M79672 Pain in left foot: Secondary | ICD-10-CM

## 2010-03-02 DIAGNOSIS — M25561 Pain in right knee: Secondary | ICD-10-CM

## 2010-03-02 DIAGNOSIS — M7710 Lateral epicondylitis, unspecified elbow: Secondary | ICD-10-CM

## 2010-03-02 DIAGNOSIS — M25562 Pain in left knee: Secondary | ICD-10-CM

## 2010-03-02 LAB — CHG LIPOPROTEIN DIRECT MEASUREMENT LDL CHOLESTEROL: LOW DENSITY LIPOPROTEIN DIRECT: 159 mg/dl — ABNORMAL HIGH (ref 0–100)

## 2010-03-02 LAB — CHOLESTEROL: Cholesterol: 206 mg/dl — ABNORMAL HIGH (ref 0–200)

## 2010-03-02 LAB — CHG LIPOPROTEIN DIR MEAS HIGH DENSITY CHOLESTEROL: HIGH DENSITY LIPOPROTEIN: 40 mg/dl (ref 29–71)

## 2010-03-02 MED ORDER — IBUPROFEN 800 MG PO TABS
800.00 mg | ORAL_TABLET | Freq: Three times a day (TID) | ORAL | Status: AC | PRN
Start: 2010-03-02 — End: 2011-03-02

## 2010-03-03 ENCOUNTER — Encounter (HOSPITAL_BASED_OUTPATIENT_CLINIC_OR_DEPARTMENT_OTHER): Payer: Self-pay

## 2010-11-03 ENCOUNTER — Encounter (HOSPITAL_BASED_OUTPATIENT_CLINIC_OR_DEPARTMENT_OTHER): Payer: Self-pay

## 2010-11-03 ENCOUNTER — Ambulatory Visit (HOSPITAL_BASED_OUTPATIENT_CLINIC_OR_DEPARTMENT_OTHER): Payer: PRIVATE HEALTH INSURANCE

## 2010-11-03 VITALS — BP 110/80 | Temp 97.7°F | Ht 65.5 in | Wt 138.0 lb

## 2010-11-03 DIAGNOSIS — Z0000 Encounter for general adult medical examination without abnormal findings: Principal | ICD-10-CM

## 2010-11-03 DIAGNOSIS — R196 Halitosis: Secondary | ICD-10-CM

## 2010-11-03 DIAGNOSIS — E785 Hyperlipidemia, unspecified: Secondary | ICD-10-CM

## 2010-11-03 DIAGNOSIS — M549 Dorsalgia, unspecified: Secondary | ICD-10-CM

## 2010-11-03 DIAGNOSIS — Z131 Encounter for screening for diabetes mellitus: Secondary | ICD-10-CM

## 2010-11-03 MED ORDER — DICLOFENAC SODIUM 75 MG PO TBEC
75.00 mg | DELAYED_RELEASE_TABLET | Freq: Two times a day (BID) | ORAL | Status: AC
Start: 2010-11-03 — End: 2010-12-02

## 2010-11-11 ENCOUNTER — Encounter (HOSPITAL_BASED_OUTPATIENT_CLINIC_OR_DEPARTMENT_OTHER): Payer: Self-pay

## 2010-11-13 ENCOUNTER — Encounter (HOSPITAL_BASED_OUTPATIENT_CLINIC_OR_DEPARTMENT_OTHER): Payer: Self-pay

## 2010-11-13 NOTE — Progress Notes (Signed)
SUBJECTIVE: 40 year old male for annual routine checkup.   I have fully reviewed the past medical, surgical, social and family history and updated the Histories section of EpicCare.      Current outpatient prescriptions ordered prior to encounter:  ibuprofen (MOTRIN) 800 MG tablet Take 1 tablet by mouth 3 (three) times daily as needed for Pain. Take with food Disp: 45 tablet Rfl: 1   DOCUSATE CALCIUM 240 MG OR CAPS OVER THE COUNTER:  1 CAPSULE AT BEDTIME AS NEEDED FOR CONSTIPATION Disp: 100 Rfl: 3   ASPIRIN 81 MG OR TABS OVER THE COUNTER; one a day, after meal Disp: 100 Rfl: 3         Allergies: Review of patient's allergies indicates no known allergies.     ROS:  No TIA's or unusual headaches, no dysphagia. No prolonged   cough. No dyspnea or chest pain on exertion.  No abdominal pain,   change in bowel habits, black or bloody stools.  No urinary tract   symptoms.      Past Medical History    Hyperlipidemia            Past Surgical History    EXC/TRPOS PTERYGIUM W/O [facility] 3/05    Comment Dr Nathaneil Canary         Current outpatient prescriptions:  diclofenac (VOLTAREN) 75 MG EC tablet Take 1 tablet by mouth 2 (two) times daily. Disp: 58 tablet Rfl: 0   ibuprofen (MOTRIN) 800 MG tablet Take 1 tablet by mouth 3 (three) times daily as needed for Pain. Take with food Disp: 45 tablet Rfl: 1   DOCUSATE CALCIUM 240 MG OR CAPS OVER THE COUNTER:  1 CAPSULE AT BEDTIME AS NEEDED FOR CONSTIPATION Disp: 100 Rfl: 3   ASPIRIN 81 MG OR TABS OVER THE COUNTER; one a day, after meal Disp: 100 Rfl: 3       Review of Patient's Allergies indicates:  No Known Allergies    Social History    Marital Status: Single              Spouse Name:                       Years of Education:                 Number of children:               Occupational History  Occupation          Loss adjuster, chartered                                    In [place] 2002; lives w/                                          3 roommates,  family    Social History Main Topics    Smoking Status: Never Smoker                      Alcohol Use: No              Drug Use: No  Sexual Activity: No                      Comment: not sexually active    Social History Narrative    Working: Investment banker, operational        Family History    Stroke Father     Comment: 58    GI Mother     Comment: died 14 after ?GI surgery         OBJECTIVE:   HEAD AND NECK:  Ears normal.  Throat, oral cavity and tongue normal.  Neck supple. No adenopathy or masses in the neck or   supraclavicular regions.  No carotid bruits. No thyromegaly.   NEURO: Cranial nerves and fundi are normal. Neck supple. DTR's normal and symmetric.    CHEST:  Clear, good air entry, no wheezes, rhonci or rales.   HEART:  S1 and S2 normal, no murmurs, clicks, gallops or   rubs.  Regular rate and rhythm.  No edema or JVD.   ABDOMEN:  Soft without tenderness, guarding, mass or organomegaly. No CVA tenderness or inguinal adenopathy.   EXTREMITIES:  Extremities, reflexes and peripheral pulses are normal.    SKIN:  No rashes or suspicious skin lesions noted.     A/p  V70.0 Routine general medical examination at a health care facility  (primary encounter diagnosis)  Comment:   Plan: screening as below    272.4 HYPERLIPIDEMIA NEC/NOS  Comment:   Plan: LIPID PANEL            V77.1D Diabetes mellitus screening  Comment:   Plan: GLUCOSE FASTING            784.99V Bad breath  Comment: patient would like to see a specialist due to persistent bad breath. I don't see any cause for it on exam.   Plan: REFERRAL TO ENT ( INT)            724.5E Back pain  Comment: musculoskeletal back pain.   Plan: pt requests medication to help. Labor intensive work.

## 2010-11-16 ENCOUNTER — Other Ambulatory Visit: Payer: Self-pay

## 2010-11-16 LAB — XR FOOT LEFT MINIMUM 3 VIEWS

## 2010-11-16 LAB — XR KNEE RIGHT 3 VIEWS

## 2010-11-16 LAB — XR KNEE LEFT 3 VIEWS

## 2011-01-06 ENCOUNTER — Encounter (HOSPITAL_BASED_OUTPATIENT_CLINIC_OR_DEPARTMENT_OTHER): Payer: Self-pay | Admitting: Otolaryngology

## 2011-01-06 ENCOUNTER — Ambulatory Visit (HOSPITAL_BASED_OUTPATIENT_CLINIC_OR_DEPARTMENT_OTHER): Payer: PRIVATE HEALTH INSURANCE | Admitting: Otolaryngology

## 2011-01-06 VITALS — BP 116/79 | HR 76 | Temp 98.6°F

## 2011-01-06 DIAGNOSIS — J312 Chronic pharyngitis: Secondary | ICD-10-CM

## 2011-01-06 MED ORDER — OMEPRAZOLE 20 MG PO CPDR
20.00 mg | DELAYED_RELEASE_CAPSULE | Freq: Every day | ORAL | Status: AC
Start: 2011-01-06 — End: 2011-04-06

## 2011-02-14 ENCOUNTER — Telehealth (HOSPITAL_BASED_OUTPATIENT_CLINIC_OR_DEPARTMENT_OTHER): Payer: Self-pay | Admitting: Otolaryngology

## 2011-02-14 NOTE — Telephone Encounter (Signed)
Have left pt two messages via portuguese interperter that we need to reschedule this appt as Dr Lorenda Peck will not be here at this time. Am sending cancellation letter dt 5/29

## 2011-03-07 ENCOUNTER — Ambulatory Visit (HOSPITAL_BASED_OUTPATIENT_CLINIC_OR_DEPARTMENT_OTHER): Payer: PRIVATE HEALTH INSURANCE | Admitting: Otolaryngology

## 2011-06-09 ENCOUNTER — Encounter (HOSPITAL_BASED_OUTPATIENT_CLINIC_OR_DEPARTMENT_OTHER): Payer: Self-pay

## 2011-06-09 ENCOUNTER — Ambulatory Visit (HOSPITAL_BASED_OUTPATIENT_CLINIC_OR_DEPARTMENT_OTHER): Payer: PRIVATE HEALTH INSURANCE

## 2011-06-09 VITALS — BP 120/80 | HR 83 | Temp 98.4°F | Wt 143.0 lb

## 2011-06-09 DIAGNOSIS — E785 Hyperlipidemia, unspecified: Secondary | ICD-10-CM

## 2011-06-09 DIAGNOSIS — Z131 Encounter for screening for diabetes mellitus: Secondary | ICD-10-CM

## 2011-06-09 DIAGNOSIS — M25562 Pain in left knee: Principal | ICD-10-CM

## 2011-06-09 LAB — GLUCOSE FASTING: GLUCOSE FASTING: 82 mg/dl (ref 74–118)

## 2011-06-09 LAB — CHG LIPID PANEL
Cholesterol: 242 mg/dl — ABNORMAL HIGH (ref 0–200)
HIGH DENSITY LIPOPROTEIN: 39 mg/dl (ref 29–71)
LOW DENSITY LIPOPROTEIN DIRECT: 177 mg/dl — ABNORMAL HIGH (ref 0–100)
RISK FACTOR: 6.2 — ABNORMAL HIGH (ref ?–5.0)
TRIGLYCERIDES: 144 mg/dl (ref 0–150)

## 2011-06-09 NOTE — Progress Notes (Signed)
Cc: knee pain    SUBJECTIVE:  Pain in knee mostly when standing. Left worse than right. Notices it cracking. Painful everyday. Pain worse in the back of the knee. Flexing the knee brings on the pain. Squatting brings on pain. Better at rest while lying down. No hx of trauma that patient can remember. Does remember twisting motion at work when pain started  Started months ago.     Repeat cholesterol and glucose fasting.     BP 120/80   Pulse 83   Temp(Src) 98.4 F (36.9 C) (Temporal)   Wt 143 lb (64.864 kg)   SpO2 96%  gen awake happy in nad  Knee exam - both normal; full range of motion, no pain on motion, no effusion, tenderness, masses, ligamentous instability or deformity noted.    A/p  719.46W Left knee pain  (primary encounter diagnosis)  Comment: will have ortho eval, as most likely meniscal  Plan: REFERRAL TO ORTHOPEDICS ( INT)            272.4 HYPERLIPIDEMIA NEC/NOS  Comment:   Plan: LIPID PANEL            V77.1D Diabetes mellitus screening  Comment:   Plan: GLUCOSE FASTING

## 2011-06-13 ENCOUNTER — Ambulatory Visit (HOSPITAL_BASED_OUTPATIENT_CLINIC_OR_DEPARTMENT_OTHER): Payer: PRIVATE HEALTH INSURANCE | Admitting: Orthopaedic Surgery

## 2011-06-13 DIAGNOSIS — M5430 Sciatica, unspecified side: Secondary | ICD-10-CM

## 2011-06-13 NOTE — Progress Notes (Signed)
This office note has been dictated. Account number [id number]

## 2011-07-14 ENCOUNTER — Encounter (HOSPITAL_BASED_OUTPATIENT_CLINIC_OR_DEPARTMENT_OTHER): Payer: Self-pay

## 2011-07-26 NOTE — Progress Notes (Signed)
Date of Service: 06/13/2011    DIAGNOSIS:  Left sciatica.    HISTORY OF PRESENT ILLNESS:  A 40 year old gentleman with 1-year complaint of intermittent "nerve pain" from the posterior aspect of his buttocks that radiates all the way down to the foot.  He reports numbness and tingling at nighttime and pain that is worse after prolonged sitting or on prolonged walking.  Denies instability, knee effusion, buckling.  He also reports some posterior knee pain, as well, but chief complaint is of pain that radiates from his posterior buttock and thigh to the foot.  The patient has a history of back pain for the past 2 years.  He takes Motrin as needed with mild relief.  For past medical history, medication, alllergies, social history, please see Epic, which has been reviewed.    REVIEW OF SYSTEMS:  All systems negative except for pertinent positives in HPI.    PHYSICAL EXAMINATION:  GENERAL:  This is a 40 year old male, no acute distress.  Awake, alert, oriented x3.  [place] speaker.  MUSCULOSKELETAL:  Inspection of the bilateral lower extremity shows intact skin, normal gait.  He has normal orthopedic exam with his hip and knees.  Supple hip motion.  Pain free.  Knee has no tenderness to palpation anywhere.  Stable ligamentous exam.  No effusion, no warmth.  Strength of the lower extremity is intact.  Normal sensation to light touch throughout the lower extremity.  He has excellent lumbar motion without any pain.  Negative straight leg raise seated and supine.  He is able to bend forward and touch his toes with knees extended without any pain.    IMAGING STUDIES:  X-ray of his knee is unremarkable.    ASSESSMENT AND PLAN:  A 40 year old gentleman with complaints and symptoms of intermittent sciatica-type symptom to the left leg.  His physical exam is absolutely benign.  Gave him some low-back stretches, specifically for sciatica, and asked him to continue over-the-counter Motrin or Tylenol as needed.  If he continues to  have pain due to his sciatica symptoms, we recommend for him to follow up with one of the physiatrists and gave him a business card so he can make a followup appointment with them.  An interpreter for [place] was utilized for today's appointment.  All questions have been answered.    ___________________________  Reviewed and Electronically Signed By: Frederico Hamman Date: 07/26/2011  Sig Time: 13:23:21  Dictated By: Harlon Flor  Dict Date: 06/13/2011 Dict Time: 03 28 PM    Dictation Date and Time:06/13/2011 15:28:30  Transcription Date and Time:06/13/2011 18:17:26  eScription Dictation id: [id number] Confirmation # :[id number]

## 2011-12-01 ENCOUNTER — Ambulatory Visit (HOSPITAL_BASED_OUTPATIENT_CLINIC_OR_DEPARTMENT_OTHER): Payer: PRIVATE HEALTH INSURANCE

## 2011-12-01 ENCOUNTER — Encounter (HOSPITAL_BASED_OUTPATIENT_CLINIC_OR_DEPARTMENT_OTHER): Payer: Self-pay

## 2011-12-01 VITALS — BP 120/70 | HR 92 | Temp 98.0°F | Wt 139.0 lb

## 2011-12-01 DIAGNOSIS — N399 Disorder of urinary system, unspecified: Principal | ICD-10-CM

## 2011-12-01 LAB — URINE DIP (POINT OF CARE)
BILIRUBIN, URINE: NEGATIVE
GLUCOSE, URINE: NEGATIVE mg/dl
KETONE, URINE: NEGATIVE mg/dl
LEUKOCYTE ESTERASE: NEGATIVE
NITRITE, URINE: NEGATIVE
OCCULT BLOOD, URINE: NEGATIVE
PH URINE: 7.5 (ref 5.0–8.0)
SPECIFIC GRAVITY URINE: 1.025 (ref 1.003–1.030)
UROBILINOGEN URINE: 0.2 mg/dl (ref 0.2–1.0)

## 2011-12-01 LAB — PROSTATIC SPECIFIC ANTIGEN: PROSTATIC SPECIFIC ANTIGEN: 1.52 ng/mL (ref 0.00–4.00)

## 2011-12-01 NOTE — Progress Notes (Signed)
SUBJECTIVE: Jonathan [name] is a 41 year old male who complains of urinary frequency, urgency and dysuria x 2 weeks days, without flank pain, fever, chills,.  Pt does not remember if has kidney stone history.     OBJECTIVE:  BP 120/70   Pulse 92   Temp(Src) 98 F (36.7 C) (Temporal)   Wt 139 lb (63.05 kg)   SpO2 98%   Appears well, in no apparent distress.  Vital signs are normal. The abdomen is soft without tenderness, guarding, mass, rebound or organomegaly. No CVA tenderness or inguinal adenopathy noted.     ASSESSMENT: Urinary issue of discomfort. Urine dip normal.     PLAN:will check culture, r/o std, check PSA    V47.4 Other urinary problems  (primary encounter diagnosis)  Comment:   Plan: URINE DIP (POINT OF CARE), PROSTATIC SPECIFIC         ANTIGEN, COLLECTION VENOUS BLOOD VENIPUNCTURE,         URINE CULTURE/COLONY COUNT, AMPLIFIED GENPROBE         CHLAM/GC

## 2011-12-02 LAB — URINE CULTURE/COLONY COUNT: URINE CULTURE/COLONY COUNT: NO GROWTH

## 2011-12-05 LAB — CHLAMYDIA GC NAAT
GENPROBE CHLAMYDIA: NEGATIVE
GENPROBE GC: NEGATIVE

## 2011-12-06 ENCOUNTER — Encounter (HOSPITAL_BASED_OUTPATIENT_CLINIC_OR_DEPARTMENT_OTHER): Payer: Self-pay

## 2011-12-11 ENCOUNTER — Encounter (HOSPITAL_BASED_OUTPATIENT_CLINIC_OR_DEPARTMENT_OTHER): Payer: Self-pay

## 2011-12-11 ENCOUNTER — Telehealth (HOSPITAL_BASED_OUTPATIENT_CLINIC_OR_DEPARTMENT_OTHER): Payer: Self-pay | Admitting: Otolaryngology

## 2011-12-11 NOTE — Telephone Encounter (Signed)
Left message for pt on voice mail via portuguese interperter we need him to come in at 72 instead of 1 tomorrow 3/26. If pt has conflict he can call [phone] for assistance. Dt 3/25.

## 2011-12-12 ENCOUNTER — Ambulatory Visit: Payer: Self-pay | Admitting: Otolaryngology

## 2011-12-12 ENCOUNTER — Ambulatory Visit (HOSPITAL_BASED_OUTPATIENT_CLINIC_OR_DEPARTMENT_OTHER): Payer: PRIVATE HEALTH INSURANCE | Admitting: Otolaryngology

## 2011-12-12 ENCOUNTER — Encounter (HOSPITAL_BASED_OUTPATIENT_CLINIC_OR_DEPARTMENT_OTHER): Payer: Self-pay | Admitting: Otolaryngology

## 2011-12-12 DIAGNOSIS — R070 Pain in throat: Principal | ICD-10-CM

## 2011-12-12 MED ORDER — OMEPRAZOLE 20 MG PO CPDR
20.0000 mg | DELAYED_RELEASE_CAPSULE | Freq: Two times a day (BID) | ORAL | Status: DC
Start: 2011-12-12 — End: 2011-12-26

## 2011-12-12 NOTE — Progress Notes (Signed)
HPI: 41 year old male Jonathan [name] is a followup for throat problems.  He is c/o bad breath and xs mucous.  He was last seen 12/2010 and prescribed Omeprazole.  He tried two months of the medication.  He was better for a while after the medication.  He is a non-smoker.  He is under the impression that he has gastritis and is concerned that when he eats wheat flour products it effects his throat.  He has not had allergy testing.  He has not seen a GI physician.      Past medical, social and family history reviewed in EPIC.    Review of Systems -   Fever No  Cough No  Easy Bruising No    EXAM  General: WD WN male with a normal voice.  Face: No lesions  Facial Strength: Normal and symmetric.  Eyes: PERRLA and EOMI.  Ear R: No lesions. Cerumen: none / minimal. Canal clear. TM clear.  Ear L:  No lesions. Cerumen: none / minimal. Canal clear. TM clear.  Nose:  Septum midline.  Turbinates normal size.  No polyps or masses.  Nasopharynx:  CNV.    Oral Cavity/Oropharynx:  Mucous membranes moist.  Tonsils 0+.  Teeth in good condition.  Tongue no lesions.  Hypopharynx ID: Base of tongue no masses. Pyriform sinuses clear.  Larynx  ID: Epiglottis no edema or lesions. Vocal cords: mobile without lesions mild posterior glottic erythema  Neck:  Trachea midline.  No palpable masses.  Thyroid: Normal to palpation.  Lymph:  No palpable nodes.  Subman glands:  Normal size, non-tender  Parotids: Normal size, no masses or nodes.    A/P  41 year old male with globus sensation and excess mucous.  I have recommended going back on the Omeprazole.  I also ordered food allergy tests.  A throat cx is pending as well as a barium swallow.

## 2011-12-14 ENCOUNTER — Ambulatory Visit: Payer: Self-pay | Admitting: Otolaryngology

## 2011-12-14 LAB — XR BARIUM SWALLOW ESOPHAGUS

## 2011-12-14 MED ORDER — SOD BICARB-CITRIC AC-SIMETH 2.21-1.53-0.04 G PO PACK
1.00 | PACK | Freq: Once | ORAL | Status: AC
Start: 2011-12-14 — End: 2011-12-14
  Administered 2011-12-14: 1 via ORAL

## 2011-12-14 MED ORDER — BARIUM SULFATE 60 % PO SUSP
100.00 mL | Freq: Once | ORAL | Status: AC
Start: 2011-12-14 — End: 2011-12-14
  Administered 2011-12-14: 100 mL via ORAL

## 2011-12-14 MED ORDER — BARIUM SULFATE 210 % PO SUSP
180.00 mL | Freq: Once | ORAL | Status: AC
Start: 2011-12-14 — End: 2011-12-14
  Administered 2011-12-14: 180 mL via ORAL

## 2011-12-14 MED ORDER — BARIUM SULFATE 700 MG PO TABS
1.00 | ORAL_TABLET | Freq: Once | ORAL | Status: AC
Start: 2011-12-14 — End: 2011-12-14
  Administered 2011-12-14: 700 mg via ORAL

## 2011-12-14 NOTE — Progress Notes (Addendum)
Addended by: Letha Cape on: 12/14/2011      Modules accepted: Orders

## 2011-12-15 LAB — ALLERGEN PROFILE BASIC FOOD
F002 MILK COW: <0.08 kU/L
F004 WHEAT: <0.08 kU/L
F008 CORN: <0.08 kU/L
F013 PEANUT: <0.08 kU/L
F014 SOYBEAN: <0.08 kU/L
F026 PORK: <0.08 kU/L
F027 BEEF: <0.08 kU/L
F052 CHOCOLATE: <0.08 kU/L
F245 EGG WHOLE: <0.08 kU/L
FX02 FISH/SHELL MIX: <0.08 kU/L

## 2011-12-15 LAB — EYE,EAR,NOSE & THROAT CULTURE

## 2011-12-26 ENCOUNTER — Ambulatory Visit (HOSPITAL_BASED_OUTPATIENT_CLINIC_OR_DEPARTMENT_OTHER): Payer: PRIVATE HEALTH INSURANCE | Admitting: Otolaryngology

## 2011-12-26 ENCOUNTER — Encounter (HOSPITAL_BASED_OUTPATIENT_CLINIC_OR_DEPARTMENT_OTHER): Payer: Self-pay | Admitting: Otolaryngology

## 2011-12-26 DIAGNOSIS — K219 Gastro-esophageal reflux disease without esophagitis: Secondary | ICD-10-CM

## 2011-12-26 MED ORDER — OMEPRAZOLE 40 MG PO CPDR
40.00 mg | DELAYED_RELEASE_CAPSULE | Freq: Every day | ORAL | Status: AC
Start: 2011-12-26 — End: 2012-06-23

## 2011-12-26 NOTE — Progress Notes (Signed)
HPI: 41 year old male Jonathan [name] is a followup for throat problems.  He restarted the Omeprazole.  He still has some throat congestion and throat clearing but this seems to be somewhat improved.  He had food allergy testing which was negative.  A barium swallow demonstrated reflux.      Past medical, social and family history reviewed in EPIC.    Review of Systems -   Fever No  Cough No  Easy Bruising No    EXAM  General: WD WN male with a normal voice.  Face: No lesions  Facial Strength: Normal and symmetric.  Eyes: PERRLA and EOMI.  Ear R: No lesions. Cerumen: none / minimal. Canal clear. TM clear.  Ear L:  No lesions. Cerumen: none / minimal. Canal clear. TM clear.  Nose:  Septum midline.  Turbinates normal size.  No polyps or masses.  Nasopharynx:  CNV    Oral Cavity/Oropharynx:  Mucous membranes moist.  Tonsils 0+.  Teeth in good condition.  Tongue no lesions.  Hypopharynx ID: Base of tongue no masses. Pyriform sinuses clear.  Larynx ID: Epiglottis no edema or lesions. Vocal cords: partially visible, mobile.  Neck:  Trachea midline.  No palpable masses.  Thyroid: Normal to palpation.  Lymph:  No palpable nodes.  Subman glands:  Normal size, non-tender  Parotids: Normal size, no masses or nodes.    A/P  41 year old male with globus sensation consistent with LPR.  As he has only been back on Omeprazole for a few weeks will continue with medical management.  Have increased to 40mg  and discussed lifestyle modifications.  If no significant improvement in 3 months will refer to general surgery for further workup.  This interview was conducted in the presence of an interpreter.

## 2011-12-27 NOTE — Progress Notes (Signed)
I have seen and examined the patient.  I agree with the findings and recommendations of the Physician Assistant Joanna Gross.

## 2012-03-26 ENCOUNTER — Ambulatory Visit (HOSPITAL_BASED_OUTPATIENT_CLINIC_OR_DEPARTMENT_OTHER): Payer: PRIVATE HEALTH INSURANCE | Admitting: Otolaryngology

## 2012-03-26 DIAGNOSIS — K2100 Gastro-esophageal reflux disease with esophagitis, without bleeding: Secondary | ICD-10-CM

## 2012-03-26 NOTE — Progress Notes (Signed)
HPI: 41 year old male Jonathan [name] is a followup for reflux + on barium swallow and 3 months of Omeprazole.  He is complaining of a dry throat and scratchy sensation in the mid thyroid cartilage area.  He is a non-smoker.  He denies dysphagia.  He also had a negative throat culture and negative food allergy serum IgE testing.    Past medical, social and family history reviewed in EPIC.    Review of Systems -   Fever No  Cough No  Easy Bruising No    EXAM  General: WD WN male with a normal voice.  Face: No lesions  Facial Strength: Normal and symmetric.  Eyes: PERRLA and EOMI.  Ear R: No lesions. Cerumen: none / minimal. Canal clear. TM clear.  Ear L:  No lesions. Cerumen: none / minimal. Canal clear. TM clear.  Nose:  Septum midline.  Turbinates normal size.  No polyps or masses.  Nasopharynx:  CNV.    Oral Cavity/Oropharynx:  Mucous membranes moist.  Tonsils 0+.  Teeth in good condition.  Tongue no lesions.  Hypopharynx ID: Base of tongue no masses. Pyriform sinuses clear.  Larynx ID: Epiglottis no edema or lesions. Vocal cords: mobile without lesions or nodules.  Neck:  Trachea midline.  No palpable masses.  Thyroid: Normal to palpation.  Lymph:  No palpable nodes.  Subman glands:  Normal size, non-tender  Parotids: Normal size, no masses or nodes.    A/P  41 year old male with ongoing throat symptoms and the only + finding that of reflux on barium swallow.  I recommended a referral to Dr. Pandora Leiter in general surgery.  The patient is in agreement.

## 2012-04-23 ENCOUNTER — Ambulatory Visit (HOSPITAL_BASED_OUTPATIENT_CLINIC_OR_DEPARTMENT_OTHER): Payer: PRIVATE HEALTH INSURANCE | Admitting: Surgery

## 2012-04-23 VITALS — BP 113/78 | HR 76 | Temp 98.0°F

## 2012-04-23 DIAGNOSIS — K219 Gastro-esophageal reflux disease without esophagitis: Principal | ICD-10-CM

## 2012-04-23 NOTE — Progress Notes (Signed)
This office note has been dictated. Account number [id number]

## 2012-04-23 NOTE — Addendum Note (Signed)
Addended by: Elisha Headland on: 04/23/2012 12:47 PM     Modules accepted: Orders

## 2012-04-24 NOTE — Progress Notes (Signed)
April 23, 2012    Everardo Pacific, MD  [facility]  [address]  [place], [place] [address]     RE:  Jonathan [name]    Dear Dr. Ubaldo Glassing:    Jonathan [name] came with a [place] translator to discuss the feeling of discomfort in his throat.  He was seen by ENT that did not see changes in his throat and was sent here for reflux evaluation as noted on his upper GI.  In talking to him carefully, he feels that he has symptoms that are limited to the throat but given the absence of anatomic pathology, I think we should begin to evaluate this with a functional study using a modified barium swallow.  This is different than the study that he has already had since it is performed by the speech and swallowing service and uses cineradiography to examine the swallowing mechanism in detail.  If that is normal, then we will proceed to a conventional reflux workup with measurements of the lower esophageal sphincter and 48-hour pH measurement with a Bravo probe.  I do look forward to following up with you.  Thirty of 30 minutes was spent with the [place] translator, coordinating his care, reviewing the diagnostic workup and the therapeutic implications.     My kindest personal regards,    ___________________________  Reviewed and Electronically Signed By: Foy Guadalajara MD  Sig Date: 04/24/2012  Sig Time: 09:24:17  Dictated By: Foy Guadalajara MD  Dict Date: 04/23/2012 Dict Time: 10 54 AM    Dictation Date and Time:04/23/2012 10:54:58  Transcription Date and Time:04/23/2012 13:46:37  eScription Dictation id: [id number] Confirmation # :[id number]    DICTATED BY: Foy Guadalajara MDSTEVEN D Mohmmad Saleeby MDD:04/23/2012 10:54:58 T:04/23/2012 13:46:37 CN Job#: [id number]

## 2012-04-30 ENCOUNTER — Ambulatory Visit (HOSPITAL_BASED_OUTPATIENT_CLINIC_OR_DEPARTMENT_OTHER): Payer: Self-pay | Admitting: Surgery

## 2012-04-30 LAB — XR SPEECH MODIFIED BARIUM SWALLOW

## 2012-04-30 LAB — SPEECH MODIFIED BARIUM SWALLOW

## 2012-05-10 ENCOUNTER — Telehealth (HOSPITAL_BASED_OUTPATIENT_CLINIC_OR_DEPARTMENT_OTHER): Payer: Self-pay | Admitting: Registered Nurse

## 2012-05-10 NOTE — Telephone Encounter (Signed)
Message copied by Irena Reichmann on Fri May 10, 2012 10:45 AM  ------       Message from: Mardelle Matte       Created: Fri May 10, 2012 10:37 AM       Regarding: rib pain        Contact: [phone]                       Jonathan [name] [id number], 41 year old, male, Telephone Information:       Home Phone      [phone]       Work Phone      [phone]       Mobile          [phone]                     Patient's Preferred Pharmacy:               [facility]       Phone: [phone] Fax: [phone]              [id number] - [place], [place] - [address]       Phone: [phone] Fax: [phone]                     CONFIRMED TODAY: Lovett Sox NUMBER: [phone]       Best time to call back:        Cell phone:        Other phone:              Available times:              Patient's language of care: [place]              Patient does need an interpreter.              Patient's PCP: Lorina Rabon              Person calling on behalf of patient: Patient (self)              Calls today with questions and concerns. Pt c/o rib pain, requesting an apt for Tuesday because his not working that day.          ------

## 2012-05-10 NOTE — Progress Notes (Signed)
Attempted call back via interpreter, left message in [place] to call back nurse

## 2012-05-15 ENCOUNTER — Ambulatory Visit (HOSPITAL_BASED_OUTPATIENT_CLINIC_OR_DEPARTMENT_OTHER): Payer: PRIVATE HEALTH INSURANCE | Admitting: Family Medicine

## 2012-05-15 VITALS — BP 120/80 | HR 74 | Temp 98.4°F | Ht 65.5 in | Wt 141.6 lb

## 2012-05-15 DIAGNOSIS — M549 Dorsalgia, unspecified: Principal | ICD-10-CM

## 2012-05-15 DIAGNOSIS — M79673 Pain in unspecified foot: Secondary | ICD-10-CM

## 2012-05-15 NOTE — Progress Notes (Signed)
SUBJECTIVE  41 yo man complains of L foot pain x 2 years and back pain x 1.5 years;  Back pain occurs every day; denies radiation; h/o sciatica would like referral to acupuncture  Using capsicain which is helpful;    Patient Active Problem List:     Headache     Other and Unspecified Hyperlipidemia     Allergic Rhinitis, Cause Unspecified     Family History of Stroke (Cerebrovascular)     Sciatica     Esophageal reflux    OBJECTIVE  BP 120/80  Pulse 74  Temp(Src) 98.4 F (36.9 C) (Temporal)  Ht 5' 5.5" (1.664 m)  Wt 141 lb 9.6 oz (64.229 kg)  BMI 23.2 kg/m2  SpO2 98%  Patient appears to be in no pain, no antalgic gait noted. Lumbosacral spine area reveals no local tenderness or mass.  Painful and reduced LS ROM noted. Straight leg raise is negative at 60 degrees on both sides. DTR's, motor strength and sensation normal, including heel and toe gait.  Peripheral pulses are palpable. X-Ray: ordered, but results not yet available.  Foot/ankle exam: normal exam, no swelling, tenderness, instability; ligaments intact, FROM all L ankle/foot joints.    A/P  (724.5) Back pain  (primary encounter diagnosis)  Comment: normal exam; no red flags; will trial acupuncture; xray ordered as pain daily x 1.5 years; ?lumbar strain as pt's job labor intensive, ?malignancy - less likely  Plan: ORDER FOR GENERAL X-RAY, REFERRAL TO         ACUPUNCTURIST ( INT)  1) rtc after acupuncture to assess pain    (729.5) Foot pain  Comment: sxs not new; 2/13 L foot xray normal; normal exam today  Plan: trial orthotics, massage and acupuncture; if no improvement referral to podiatry

## 2012-05-15 NOTE — Patient Instructions (Addendum)
1) try using advil every 6 hours as needed for back pain; take with food

## 2012-05-21 ENCOUNTER — Ambulatory Visit (HOSPITAL_BASED_OUTPATIENT_CLINIC_OR_DEPARTMENT_OTHER): Payer: PRIVATE HEALTH INSURANCE | Admitting: Surgery

## 2012-05-21 DIAGNOSIS — K219 Gastro-esophageal reflux disease without esophagitis: Secondary | ICD-10-CM

## 2012-05-21 NOTE — Progress Notes (Signed)
This office note has been dictated. Account number [id number]

## 2012-05-29 NOTE — Progress Notes (Signed)
May 21, 2012        Everardo Pacific, MD  [facility]  [address]  [place], [place]  [address]    RE:  Jonathan [name]    Dear Dr. Ubaldo Glassing:    It was a pleasure to see your patient, Jonathan [name], again today.  We have reviewed the results of the modified barium swallow performed by speech pathologist.  There is no evidence of aspiration, although there is a little bit of residual food material which does not explain his symptoms.      As noted from our previous evaluation, we will begin a reflux workup with a 48-hour BRAVO and esophageal manometry.  I have explained it to him that this would be the next phase of this and in addition, we would perform an upper endoscopy at the [facility] under the auspices of the GI service.    I will see him back after his tests are performed so we can determine whether or not the gastroesophageal reflux is causing his symptoms.  I do look forward to following up with you.      Fifteen of fifteen minutes spent outlining the therapeutic and diagnostic care plan and coordinating his care.    My kindest personal regards,    ___________________________  Reviewed and Electronically Signed By: Foy Guadalajara MD  Sig Date: 05/29/2012  Sig Time: 09:43:22  Dictated By: Foy Guadalajara MD  Dict Date: 05/21/2012 Dict Time: 04 19 PM    Dictation Date and Time:05/21/2012 16:19:55  Transcription Date and Time:05/21/2012 16:58:24  eScription Dictation id: [id number] Confirmation # :[id number]    DICTATED BY: Foy Guadalajara MDSTEVEN D Graeson Nouri MDD:05/21/2012 16:19:55 T:05/21/2012 16:58:24 DN Job#: [id number]

## 2012-06-04 ENCOUNTER — Encounter (HOSPITAL_BASED_OUTPATIENT_CLINIC_OR_DEPARTMENT_OTHER): Payer: Self-pay

## 2012-06-13 ENCOUNTER — Ambulatory Visit (HOSPITAL_BASED_OUTPATIENT_CLINIC_OR_DEPARTMENT_OTHER)
Admit: 2012-06-13 | Disposition: A | Discharge status: Home / Self Care | Payer: Self-pay | Source: Ambulatory Visit | Attending: Gastroenterology | Admitting: Gastroenterology

## 2012-06-13 LAB — GI OPERATIVE NOTE

## 2012-06-13 MED ORDER — FENTANYL CITRATE 0.05 MG/ML IJ SOLN
25.0000 ug | INTRAMUSCULAR | Status: DC
Start: 2012-06-13 — End: 2012-06-14
  Administered 2012-06-13: 200 ug via INTRAVENOUS

## 2012-06-13 MED ORDER — MIDAZOLAM HCL 5 MG/5ML IJ SOLN
INTRAMUSCULAR | Status: AC
Start: 2012-06-13 — End: 2012-06-13
  Administered 2012-06-13: 8 mg via INTRAVENOUS
  Filled 2012-06-13: qty 10

## 2012-06-13 MED ORDER — LIDOCAINE HCL 2 % EX GEL
CUTANEOUS | Status: DC
Start: 2012-06-13 — End: 2012-06-14
  Filled 2012-06-13: qty 5

## 2012-06-13 MED ORDER — FENTANYL CITRATE 0.05 MG/ML IJ SOLN
INTRAMUSCULAR | Status: AC
Start: 2012-06-13 — End: 2012-06-13
  Administered 2012-06-13: 200 ug via INTRAVENOUS
  Filled 2012-06-13: qty 4

## 2012-06-13 MED ORDER — SODIUM CHLORIDE 0.9 % IV SOLN
INTRAVENOUS | Status: AC
Start: 2012-06-13 — End: 2012-06-13
  Filled 2012-06-13: qty 500

## 2012-06-13 MED ORDER — MIDAZOLAM HCL 5 MG/5ML IJ SOLN
0.5000 mg | INTRAMUSCULAR | Status: DC
Start: 2012-06-13 — End: 2012-06-14
  Administered 2012-06-13: 8 mg via INTRAVENOUS

## 2012-06-13 MED ORDER — WATER FOR IRRIGATION, STERILE IR SOLN
Freq: Once | Status: DC
Start: 2012-06-13 — End: 2012-06-14

## 2012-06-13 MED ORDER — SODIUM CHLORIDE 0.9 % IV SOLN
INTRAVENOUS | Status: DC
Start: 2012-06-13 — End: 2012-06-14
  Administered 2012-06-13 (×2): via INTRAVENOUS

## 2012-06-13 MED ORDER — DIPHENHYDRAMINE HCL 50 MG/ML IJ SOLN
25.0000 mg | Freq: Once | INTRAMUSCULAR | Status: DC | PRN
Start: 2012-06-13 — End: 2012-06-14

## 2012-06-13 MED ORDER — LIDOCAINE HCL 2 % EX GEL
CUTANEOUS | Status: DC
Start: 2012-06-13 — End: 2012-06-13
  Filled 2012-06-13: qty 5

## 2012-06-13 NOTE — Progress Notes (Signed)
Bravo did not deploy onto esophagus; 2nd Bravo catheter being calibrated, procedure is delayed due to this malfunction.

## 2012-06-13 NOTE — H&P (Signed)
GI Pre-procedure History and Physical Short Form  Jonathan [name] is an 41 year old male.    Chief Complaint: He is being scheduled for Esophageal motility study and Bravo study    The history is provided by the patient. No language interpreter was used.           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, family     Social History Main Topics   Smoking status: Never Smoker     Smokeless tobacco:     Alcohol Use: No    Drug Use: No    Sexually Active: No    Comment: not sexually active     Other Topics Concern   None on file     Social History Narrative    Working: Investment banker, operational       Family History    Stroke Father     Comment: 71    GI Mother     Comment: died 32 after ?GI surgery       Allergies:   Review of Patient's Allergies indicates:  No Known Allergies    Medications:     Current Outpatient Prescriptions on File Prior to Encounter:  ibuprofen (ADVIL) 200 MG capsule Take 200 mg by mouth every 4 (four) hours as needed. Disp:  Rfl:    omeprazole (PRILOSEC) 40 MG capsule Take 1 capsule by mouth daily. 20 minutes prior to first meal of day. Disp: 30 capsule Rfl: 5   DOCUSATE CALCIUM 240 MG OR CAPS OVER THE COUNTER:  1 CAPSULE AT BEDTIME AS NEEDED FOR CONSTIPATION Disp: 100 Rfl: 3   ASPIRIN 81 MG OR TABS OVER THE COUNTER; one a day, after meal Disp: 100 Rfl: 3     No current facility-administered medications on file prior to encounter.    Vitals:  BP 114/80  Temp(Src) 99 F  (37.2 C) (Temporal)  SpO2 98%    Review of Systems   Constitutional: Negative for fever, activity change and appetite change.   HENT: Negative.    Respiratory: Negative.    Cardiovascular: Negative.    Gastrointestinal: Negative for abdominal pain, blood in stool, abdominal distention and anal bleeding.   Genitourinary: Negative.    Musculoskeletal: Negative.    Neurological: Negative.           Physical Exam   Vitals reviewed.  Constitutional: He is oriented to person, place, and time. He appears well-developed and well-nourished. No distress.   HENT:   Head: Normocephalic and atraumatic.   Mouth/Throat: Oropharynx is clear and moist.   Eyes: Conjunctivae are normal.   Neck: Normal range of motion. Neck supple.   Cardiovascular: Normal rate and normal heart sounds.    Pulmonary/Chest: Effort normal and breath  sounds normal. No respiratory distress. He has no wheezes. He has no rales.   Abdominal: Soft. Bowel sounds are normal. He exhibits no mass (No hepato-splenomegaly).   Neurological: He is alert and oriented to person, place, and time.   Skin: Skin is warm and dry. He is not diaphoretic.          Airway Evaluation:  Gag reflex intact: Yes  Ability to open mouth wide:   Full  Dentures:  No  Loose teeth:  No  Neck range of motion  Full    Mallampati Airway Classification: Class I     A soft palate, fauces, uvula, anterior and posterior tonsil pillars are seen.  Mallampati Airway Classification:     ASA Classification: ASA Class I (a normally healthy patient)    Assessment:  Proceed with procedure

## 2012-06-13 NOTE — Discharge Instructions (Signed)
[facility] DISCHARGE INSTRUCTIONS     Quando voc for para casa  possvel que se sinta sonolento(a). Descanse bastante pelo resto do dia.   When you return home you may feel sleepy. Get plenty of rest for the remainder of the day.    Se voc tiver recebido algum sedative para o procedimento NO DIRIJA, NO  MANUSEIE MQUINAS NEM TOME NENHUMA DECISO IMPORTANTE durante o resto do dia.  If you received sedation for your procedure DO NOT DRIVE, OPERATE MACHINERY, OR MAKE IMPORTANT DECISIONS for the remainder of the day.          Aps ter feito uma ENDOSCOPIA DIGESTIVA ALTA (GASTROENTEROSCOPIA)  normal se ter uma dor de garganta branda que pode durar por alguns dias, mas se voc tiver  DORES NO PEITO, FALTA DE AR, DIFICULDADE PARA ENGOLIR, VMITOS DE SANGUE VERMELHO VIVO OU SE NOTAR AS FEZES NEGRAS OU MARROM ESCURAS OU SE VOC SE SENTIR FRACO(A) OU CANSADO(A), entre em contato com o seu mdico imediatamente.    It is normal after having an UPPER ENDOSCOPY to develop a mild sore throat that will last for a few days, but, if you develop CHEST PAIN, SHORTNESS OF BREATH, DIFFICULTY SWALLOWING, VOMIT BRIGHT RED BLOOD, NOTICE YOUR BOWEL MOVEMENTS ARE BLACK OR MAROON COLORED OR YOU FEEL WEAK AND TIRED, call your doctor immediately.     Se tiver feito uma bipsia ou Polipectomia voc ter que suspender a aspirina ou os medicamentos que contenham aspirina por  3 dias.                  If you had a biopsy or Polypectomy you will need to hold aspirin or medication containing aspirin for __3_days.        Se voc tiver feito uma bipsia ou Polipectomia voc ter que suspender qualquer medicamento tais como Advil, Motrin, Naproxin, Ibuprofen etc por {3} dias.  If you had a biopsy or Polypectomy you will need to hold any medication such as Advil, Motrin, Naproxin, Ibuprofen etc for_3__days.     Entre em contato com o seu mdico se houver qualquer outro sintoma fora do  normal.  Call you physician for any other unusual symptoms.     Se por qualquer razo voc no conseguir entrar em contato com o seu mdico v para a sala de Freight forwarder prxima.   If for any reason you are unable to reach your doctor go to the nearest Emergency Room.    Instrues Especficas:***Return the monitor to GI clinic on 06/17/12. The monitor will stop recording in 48 hours so you can remove it then  Specific Instructions: Hiatal hernia. Biopsies taken. Await pathology.results. Return to referring physician.

## 2012-06-14 ENCOUNTER — Encounter (HOSPITAL_BASED_OUTPATIENT_CLINIC_OR_DEPARTMENT_OTHER): Payer: Self-pay

## 2012-06-17 LAB — SURGICAL PATH SPECIMEN

## 2012-07-02 ENCOUNTER — Ambulatory Visit (HOSPITAL_BASED_OUTPATIENT_CLINIC_OR_DEPARTMENT_OTHER): Payer: PRIVATE HEALTH INSURANCE | Admitting: Surgery

## 2012-07-02 ENCOUNTER — Encounter (HOSPITAL_BASED_OUTPATIENT_CLINIC_OR_DEPARTMENT_OTHER): Payer: Self-pay | Admitting: Surgery

## 2012-07-02 DIAGNOSIS — K219 Gastro-esophageal reflux disease without esophagitis: Secondary | ICD-10-CM

## 2012-07-02 MED ORDER — RANITIDINE HCL 300 MG PO CAPS
300.00 mg | ORAL_CAPSULE | Freq: Every evening | ORAL | Status: AC
Start: 2012-07-02 — End: 2012-08-02

## 2012-07-02 NOTE — Progress Notes (Signed)
This office note has been dictated. Account number [id number]

## 2012-07-03 NOTE — Progress Notes (Signed)
July 02, 2012     Everardo Pacific, MD  [facility]  [address]   [place], [place] [address]     RE:  Damaree Wintjen     Dear Ubaldo Glassing:     It was a pleasure to see your patient Mr. Jonathan [name] today.  I talked to him through the [place] translator and obtained the results of his pH probe and motility.  His esophageal motility is normal, and his 48-hour Brava shows minimal elevations of acid in the esophagus to 6.6% with a pH less than 4.  This is minimally elevated.  There was some symptom correlation, but he only had a few episodes of gastroesophageal reflux.  Careful questioning by the translator does seem to indicate that he feels better off his omeprazole than on it.  I am having trouble determining whether this is a symptom side effect, or whether this is simply he has atypical reflux.  For this reason, I have stopped his omeprazole and started H2 blockers to see if there is a difference in his symptoms.  In addition, he is complaining of headaches, which also could be a side effect of PPI therapy.      25 minutes was spent with the patient with the [place] translator counseling him on the various therapeutic options and how this would impact the treatment plan.      My kindest personal regards,    ___________________________  Reviewed and Electronically Signed By: Foy Guadalajara MD  Sig Date: 07/03/2012  Sig Time: 10:52:07  Dictated By: Foy Guadalajara MD  Dict Date: 07/02/2012 Dict Time: 12 57 PM    Dictation Date and Time:07/02/2012 12:57:57  Transcription Date and Time:07/02/2012 15:04:45  eScription Dictation id: [id number] Confirmation # :[id number]    DICTATED BY: Foy Guadalajara MDSTEVEN D Alexsander Cavins MDD:07/02/2012 12:57:57 T:07/02/2012 15:04:45 VN Job#: [id number]

## 2012-07-31 ENCOUNTER — Ambulatory Visit (HOSPITAL_BASED_OUTPATIENT_CLINIC_OR_DEPARTMENT_OTHER): Payer: PRIVATE HEALTH INSURANCE

## 2012-07-31 DIAGNOSIS — M545 Low back pain, unspecified: Secondary | ICD-10-CM

## 2012-08-02 NOTE — Progress Notes (Signed)
See scanned note.

## 2012-08-06 ENCOUNTER — Ambulatory Visit (HOSPITAL_BASED_OUTPATIENT_CLINIC_OR_DEPARTMENT_OTHER): Payer: PRIVATE HEALTH INSURANCE | Admitting: Surgery

## 2012-08-06 VITALS — BP 112/70 | HR 68 | Temp 98.3°F

## 2012-08-06 DIAGNOSIS — K219 Gastro-esophageal reflux disease without esophagitis: Secondary | ICD-10-CM

## 2012-08-06 NOTE — Progress Notes (Signed)
Date of Service: 08/06/2012    August 06, 2012     Everardo Pacific, MD   [facility]   [address]   [place], [place] [address]    RE:  Jonathan [name]     Dear Dr Ubaldo Glassing:     It was a pleasure to see your patient, Jonathan [name], again today.  As you know, at our last visit we stopped his anti-acid medication.  There seems to be no difference whether he is on the medication or not.  As you know, his previous reflux study showed preserved motility and a very mild increase in acid exposure.  When I asked him what his worst symptom is, it is not pain, it is dryness in the throat when he wakes up.  To that end, I have given him instructions on how to buy a humidifier.  I would like to continue to manage him by trying him off medication with nighttime bedroom humidification.  I have made an appointment to see me in a month's time to see whether this relieves his symptom which he rates as 5/10 on the dryness scale, and we may find that his symptoms are adequately served this way.  We certainly have higher grade therapy as options, but this may not be necessary.  Twelve of 15 minutes was spent counseling the patient, reviewing what the therapeutic options are, and coordinating his care.    My kindest personal regards,    ___________________________  Reviewed and Electronically Signed By: Foy Guadalajara MD  Sig Date: 09/17/2012  Sig Time: 12:24:06  Dictated By: Foy Guadalajara MD  Dict Date: 08/06/2012 Dict Time: 10 37 AM    Dictation Date and Time:08/06/2012 10:37:30  Transcription Date and Time:08/06/2012 15:46:40  eScription Dictation id: [id number] Confirmation # :[id number]      cc: Everardo Pacific MD    DICTATED BY: Foy Guadalajara MDSTEVEN D Lachrista Heslin MDD:08/06/2012 10:37:30 T:08/06/2012 15:46:40 AN Job#: [id number]

## 2012-08-06 NOTE — Progress Notes (Signed)
This office note has been dictated. Account number [id number]

## 2012-08-07 ENCOUNTER — Ambulatory Visit (HOSPITAL_BASED_OUTPATIENT_CLINIC_OR_DEPARTMENT_OTHER): Payer: PRIVATE HEALTH INSURANCE

## 2012-08-07 DIAGNOSIS — M545 Low back pain, unspecified: Secondary | ICD-10-CM

## 2012-08-14 NOTE — Progress Notes (Signed)
See scanned note.

## 2012-08-21 ENCOUNTER — Ambulatory Visit (HOSPITAL_BASED_OUTPATIENT_CLINIC_OR_DEPARTMENT_OTHER): Payer: PRIVATE HEALTH INSURANCE

## 2012-08-21 DIAGNOSIS — M545 Low back pain, unspecified: Secondary | ICD-10-CM

## 2012-08-30 NOTE — Progress Notes (Signed)
See scanned note.

## 2012-09-03 ENCOUNTER — Ambulatory Visit (HOSPITAL_BASED_OUTPATIENT_CLINIC_OR_DEPARTMENT_OTHER): Payer: PRIVATE HEALTH INSURANCE | Admitting: Surgery

## 2013-01-27 ENCOUNTER — Encounter (HOSPITAL_BASED_OUTPATIENT_CLINIC_OR_DEPARTMENT_OTHER): Payer: Self-pay

## 2013-01-27 ENCOUNTER — Ambulatory Visit (HOSPITAL_BASED_OUTPATIENT_CLINIC_OR_DEPARTMENT_OTHER): Payer: PRIVATE HEALTH INSURANCE

## 2013-01-27 VITALS — BP 112/84 | HR 77 | Temp 97.6°F | Wt 141.6 lb

## 2013-01-27 DIAGNOSIS — M25569 Pain in unspecified knee: Secondary | ICD-10-CM

## 2013-01-27 DIAGNOSIS — E781 Pure hyperglyceridemia: Secondary | ICD-10-CM

## 2013-01-27 DIAGNOSIS — Z0000 Encounter for general adult medical examination without abnormal findings: Secondary | ICD-10-CM

## 2013-01-27 LAB — CBC, PLATELET & DIFFERENTIAL
ABSOLUTE BASO COUNT: 0 10*3/uL (ref 0.0–0.1)
ABSOLUTE EOSINOPHIL COUNT: 0.1 10*3/uL (ref 0.0–0.8)
ABSOLUTE IMM GRAN COUNT: 0.01 10*3/uL (ref 0.00–0.03)
ABSOLUTE LYMPH COUNT: 1.9 10*3/uL (ref 0.6–5.9)
ABSOLUTE MONO COUNT: 0.4 10*3/uL (ref 0.2–1.4)
ABSOLUTE NEUTROPHIL COUNT: 3.2 10*3/uL (ref 1.6–8.3)
BASOPHIL %: 0.2 % (ref 0.0–1.2)
EOSINOPHIL %: 1.5 % (ref 0.0–7.0)
HEMATOCRIT: 45.4 % (ref 40.1–51.0)
HEMOGLOBIN: 15.8 g/dL (ref 13.7–17.5)
IMMATURE GRANULOCYTE %: 0.2 % (ref 0.0–0.4)
LYMPHOCYTE %: 34.1 % (ref 15.0–54.0)
MEAN CORP HGB CONC: 34.8 g/dL (ref 31.0–37.0)
MEAN CORPUSCULAR HGB: 29.7 pg (ref 26.0–34.0)
MEAN CORPUSCULAR VOL: 85.3 fL (ref 80.0–100.0)
MEAN PLATELET VOLUME: 11.8 fL (ref 8.7–12.5)
MONOCYTE %: 6.4 % (ref 4.0–13.0)
NEUTROPHIL %: 57.6 % (ref 40.0–75.0)
PLATELET COUNT: 200 10*3/uL (ref 150–400)
RBC DISTRIBUTION WIDTH STD DEV: 40 fL (ref 35.1–46.3)
RBC DISTRIBUTION WIDTH: 12.8 % (ref 11.5–14.3)
RED BLOOD CELL COUNT: 5.32 M/uL (ref 4.60–6.10)
WHITE BLOOD CELL COUNT: 5.5 10*3/uL (ref 4.0–11.0)

## 2013-01-27 LAB — COMPREHENSIVE METABOLIC PANEL
ALANINE AMINOTRANSFERASE: 35 U/L (ref 12–45)
ALBUMIN: 4 g/dL (ref 3.4–5.0)
ALKALINE PHOSPHATASE: 79 U/L (ref 45–117)
ANION GAP: 6 mmol/L (ref 5–15)
ASPARTATE AMINOTRANSFERASE: 17 U/L (ref 8–34)
BILIRUBIN TOTAL: 0.6 mg/dL (ref 0.2–1.0)
BUN (UREA NITROGEN): 14 mg/dL (ref 7–18)
CALCIUM: 8.9 mg/dL (ref 8.5–10.1)
CARBON DIOXIDE: 28 mmol/L (ref 21–32)
CHLORIDE: 104 mmol/L (ref 98–107)
CREATININE: 0.5 mg/dL — ABNORMAL LOW (ref 0.7–1.2)
ESTIMATED GLOMERULAR FILT RATE: >60 mL/min (ref >60)
Glucose Random: 88 mg/dL (ref 74–160)
POTASSIUM: 4.3 mmol/L (ref 3.5–5.1)
SODIUM: 138 mmol/L (ref 136–145)
TOTAL PROTEIN: 7 g/dL (ref 6.4–8.2)

## 2013-01-27 LAB — LIPID PANEL
Cholesterol: 211 mg/dL (ref 0–239)
HIGH DENSITY LIPOPROTEIN: 37 mg/dL — ABNORMAL LOW (ref 40–60)
LOW DENSITY LIPOPROTEIN DIRECT: 151 mg/dL (ref 0–189)
TRIGLYCERIDES: 170 mg/dL — ABNORMAL HIGH (ref 0–150)

## 2013-01-27 NOTE — Progress Notes (Signed)
HPI:  Jonathan [name] is a 42 year old Portuguese-speaking male with a h/o chronic L knee pain x several yrs. who presents today with worsening pain x ~ 2 weeks.  He was seen by orthopedics in 2012 & his pain then was felt to be r/t sciatica, for which he was advised to take Motrin/Tylenol prn & to f/u with PT if sx. persisted.  He mentions that he's been taking Advil recently without significant relief & denies injury, decreased mobility or difficulty walking.      Review of Patient's Allergies indicates:  No Known Allergies      Current Outpatient Prescriptions:  ibuprofen (ADVIL) 200 MG capsule Take 200 mg by mouth every 4 (four) hours as needed. Disp:  Rfl:    ASPIRIN 81 MG OR TABS OVER THE COUNTER; one a day, after meal Disp: 100 Rfl: 3     No current facility-administered medications for this visit.  Patient Active Problem List:     Headache     Other and Unspecified Hyperlipidemia     Allergic Rhinitis, Cause Unspecified     Family History of Stroke (Cerebrovascular)     Sciatica     Esophageal reflux      ROS:  Pertinent positives per HPI; all other systems were reviewed & are negative.      PE:  BP 112/84  Pulse 77  Temp(Src) 97.6 F (36.4 C) (Oral)  Wt 141 lb 9.6 oz (64.229 kg)  BMI 23.2 kg/m2  SpO2 100%  GEN:  Well-appearing in NAD  MSKL:  L knee without warmth, tenderness, effusion, deformity or ligament instability with full ROM      ASSESSMENT/PLAN:  Jonathan [name] is a 42 year old Portuguese-speaking male with a h/o chronic L knee pain x several yrs. who presents today with worsening pain x ~ 2 weeks.    1)  (V70.9) Physical exam  (primary encounter diagnosis)  Comment:  H/o hyperlipidemia without meds.  Plan: CBC + PLT + AUTO DIFF, COMPREHENSIVE METABOLIC PANEL, LIPID PANEL           2)  (719.46) Pain in joint, lower leg  Comment:  Current pain seems more arthritic than r/t sciatica  Plan: REFERRAL TO RHEUMATOLOGY ( INT)          All of Mr. Corbeil questions were answered via a [place] telephone  interpreter & he will RTC if he has other concerns. Greater than 50% of this 30 minute visit was spent counseling him & coordinating care.

## 2013-02-18 ENCOUNTER — Telehealth (HOSPITAL_BASED_OUTPATIENT_CLINIC_OR_DEPARTMENT_OTHER): Payer: Self-pay

## 2013-02-18 NOTE — Progress Notes (Signed)
Left voicemail with patient re: Rheumatology appt scheduled for 03/12/2013 with Dr. Lenor Derrick

## 2013-03-11 ENCOUNTER — Telehealth (HOSPITAL_BASED_OUTPATIENT_CLINIC_OR_DEPARTMENT_OTHER): Payer: Self-pay | Admitting: Internal Medicine

## 2013-03-11 NOTE — Progress Notes (Addendum)
Called spoke to patient confirmed NP appt with Dr Lenor Derrick on 03/12/2013 at 12:20 pm.

## 2013-03-12 ENCOUNTER — Ambulatory Visit (HOSPITAL_BASED_OUTPATIENT_CLINIC_OR_DEPARTMENT_OTHER): Payer: PRIVATE HEALTH INSURANCE | Admitting: Internal Medicine

## 2013-03-12 ENCOUNTER — Telehealth (HOSPITAL_BASED_OUTPATIENT_CLINIC_OR_DEPARTMENT_OTHER): Payer: Self-pay | Admitting: Internal Medicine

## 2013-03-12 NOTE — Progress Notes (Addendum)
Called spoke to patient rescheduled missed appt with Dr Lenor Derrick on 05/05/2013 at 1:00pm.

## 2013-04-14 ENCOUNTER — Ambulatory Visit (HOSPITAL_BASED_OUTPATIENT_CLINIC_OR_DEPARTMENT_OTHER): Payer: PRIVATE HEALTH INSURANCE | Admitting: Internal Medicine

## 2013-04-14 VITALS — BP 114/74 | HR 61 | Ht 65.5 in | Wt 142.0 lb

## 2013-04-14 DIAGNOSIS — M25561 Pain in right knee: Principal | ICD-10-CM

## 2013-04-14 DIAGNOSIS — M25562 Pain in left knee: Principal | ICD-10-CM

## 2013-04-14 MED ORDER — CAPSAICIN 0.025 % EX CREA
TOPICAL_CREAM | Freq: Four times a day (QID) | CUTANEOUS | Status: DC | PRN
Start: 2013-04-14 — End: 2013-07-16

## 2013-06-06 ENCOUNTER — Telehealth (HOSPITAL_BASED_OUTPATIENT_CLINIC_OR_DEPARTMENT_OTHER): Payer: Self-pay | Admitting: Clinic/Center

## 2013-06-09 ENCOUNTER — Ambulatory Visit (HOSPITAL_BASED_OUTPATIENT_CLINIC_OR_DEPARTMENT_OTHER): Payer: PRIVATE HEALTH INSURANCE | Admitting: Family Medicine

## 2013-06-09 VITALS — BP 114/80 | HR 78 | Temp 97.5°F | Wt 143.4 lb

## 2013-06-09 DIAGNOSIS — F411 Generalized anxiety disorder: Secondary | ICD-10-CM

## 2013-06-09 DIAGNOSIS — R0789 Other chest pain: Secondary | ICD-10-CM

## 2013-06-09 DIAGNOSIS — H9190 Unspecified hearing loss, unspecified ear: Secondary | ICD-10-CM

## 2013-06-09 DIAGNOSIS — B353 Tinea pedis: Secondary | ICD-10-CM

## 2013-06-09 MED ORDER — TERBINAFINE HCL 1 % EX CREA
TOPICAL_CREAM | Freq: Two times a day (BID) | CUTANEOUS | Status: AC
Start: 2013-06-09 — End: 2013-06-23

## 2013-06-18 ENCOUNTER — Encounter (HOSPITAL_BASED_OUTPATIENT_CLINIC_OR_DEPARTMENT_OTHER): Payer: Self-pay

## 2013-07-07 ENCOUNTER — Ambulatory Visit (HOSPITAL_BASED_OUTPATIENT_CLINIC_OR_DEPARTMENT_OTHER): Payer: PRIVATE HEALTH INSURANCE | Admitting: Audiologist-Hearing Aid Fitter

## 2013-07-07 DIAGNOSIS — H918X3 Other specified hearing loss, bilateral: Secondary | ICD-10-CM

## 2013-07-07 DIAGNOSIS — H918X9 Other specified hearing loss, unspecified ear: Principal | ICD-10-CM

## 2013-07-07 NOTE — Progress Notes (Signed)
History:  Jonathan [name] is a 42 year old male who reports no hearing in the left ear and that it was a SHL that happened over 20 years ago when he was in [place]. Occasional tinnitus and no dizziness reported.    Impressions:  Tympanometry is consistent with normal TM mobility and normal middle ear pressure in both ears. 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 WRS Excellent in the right ear. A sensorineural asymmetry, poorer in the left ear.    Recommendation:  Otologic consultation  Re-evaluation upon Physician's request.

## 2013-07-16 ENCOUNTER — Ambulatory Visit (HOSPITAL_BASED_OUTPATIENT_CLINIC_OR_DEPARTMENT_OTHER): Payer: PRIVATE HEALTH INSURANCE | Admitting: Internal Medicine

## 2013-07-16 VITALS — BP 108/69 | HR 72 | Ht 65.0 in | Wt 142.0 lb

## 2013-07-16 DIAGNOSIS — M25569 Pain in unspecified knee: Principal | ICD-10-CM

## 2013-07-16 MED ORDER — CAPSAICIN 0.025 % EX CREA
TOPICAL_CREAM | Freq: Four times a day (QID) | CUTANEOUS | Status: DC | PRN
Start: 2013-07-16 — End: 2015-11-23

## 2013-07-16 NOTE — Progress Notes (Addendum)
Date of Service: 07/16/2013    HISTORY OF PRESENT ILLNESS:  This is a follow-up visit for this 42 year old gentleman whom I met in consultation regarding knee pain.     Knee pain is improving.  He denies knee swelling.  He denies other joint pain, swelling, and stiffness.  He denies fevers, unintentional weight loss, chest pain, and dyspnea, cough, rash, Raynaud's, diarrhea, hematochezia, and back pain.    He did not participate in physical therapy.   He has been using capsaicin, which helps.  He rarely uses ibuprofen.      PAST MEDICAL HISTORY:  Gastritis, hiatal hernia, headaches, white matter changes seen on MRI, head MRI, decreased hearing in the left ear, right-sided pterygium requiring removal, left, pterygium, history of sciatica, allergic rhinitis, injury to his left third finger in a car door at the age of 42.    ALLERGIES:  No known drug allergies.    CURRENT MEDICATIONS:  Capsaicin.    PHYSICAL EXAMINATION:  VITAL SIGNS:  Height 5 feet 5 inches, weight is 142 pounds, BMI 24, blood pressure 108/69, pulse 72 with 100%, pain 0/10.  GENERAL:  He is in no acute distress.  He is alert and oriented.  No malar rash.  HEENT:  Anicteric sclerae.  Oropharynx clear.  NECK:  Supple.  LUNGS:  Clear.  EXTREMITIES:  No pitting edema.  SKIN:  No rashes.  MUSCULOSKELETAL:  No synovitis, no tender joints.  No tender muscles.  There is no joint swelling.  No laxity in the knees.  A normal narrow-based gait.    LABORATORY DATA:  Creatinine 0.5, GFR greater than 60, hematocrit 45    ASSESSMENT:  A 42 year old gentleman with bilateral knee pain improved on capsaicin.    PLAN:    1.  He will continue capsaicin and ibuprofen as needed.    2.  He agrees to follow up with physical therapy.   3.  I will reassess him in 3 months.    ___________________________  Reviewed and Electronically Signed By: Charlane Ferretti MD  Sig Date: 01/20/2014  Sig Time: 08:46:56  Dictated By: Charlane Ferretti MD  Dict Date: 07/16/2013 Dict Time: 05  07 PM    Dictation Date and Time:07/16/2013 17:07:19  Transcription Date and Time:07/16/2013 18:05:16  eScription Dictation id: [id number] Confirmation # :[id number]      cc: Everardo Pacific MD

## 2013-07-25 ENCOUNTER — Ambulatory Visit (HOSPITAL_BASED_OUTPATIENT_CLINIC_OR_DEPARTMENT_OTHER): Payer: PRIVATE HEALTH INSURANCE | Admitting: Otolaryngology

## 2013-07-25 ENCOUNTER — Encounter (HOSPITAL_BASED_OUTPATIENT_CLINIC_OR_DEPARTMENT_OTHER): Payer: Self-pay | Admitting: Otolaryngology

## 2013-07-25 DIAGNOSIS — H9192 Unspecified hearing loss, left ear: Principal | ICD-10-CM

## 2013-10-15 ENCOUNTER — Ambulatory Visit (HOSPITAL_BASED_OUTPATIENT_CLINIC_OR_DEPARTMENT_OTHER): Payer: PRIVATE HEALTH INSURANCE | Admitting: Internal Medicine

## 2013-11-22 ENCOUNTER — Encounter (HOSPITAL_BASED_OUTPATIENT_CLINIC_OR_DEPARTMENT_OTHER): Payer: Self-pay

## 2013-11-22 ENCOUNTER — Emergency Department (HOSPITAL_BASED_OUTPATIENT_CLINIC_OR_DEPARTMENT_OTHER)
Admission: RE | Admit: 2013-11-22 | Disposition: A | Discharge status: Home / Self Care | Payer: Self-pay | Source: Emergency Department

## 2013-11-22 MED ORDER — ALUMINUM & MAGNESIUM HYDROXIDE 200-200 MG/5ML PO SUSP
30.00 mL | Freq: Once | ORAL | Status: AC
Start: 2013-11-22 — End: 2013-11-22
  Administered 2013-11-22: 30 mL via ORAL
  Filled 2013-11-22: qty 30

## 2013-11-22 MED ORDER — PANTOPRAZOLE SODIUM 40 MG PO TBEC
DELAYED_RELEASE_TABLET | ORAL | Status: AC
Start: 2013-11-22 — End: 2013-11-22
  Filled 2013-11-22: qty 1

## 2013-11-22 MED ORDER — LIDOCAINE VISCOUS 2 % MT SOLN
OROMUCOSAL | Status: DC
Start: 2013-11-22 — End: 2013-11-22
  Filled 2013-11-22: qty 15

## 2013-11-22 MED ORDER — PANTOPRAZOLE SODIUM 40 MG PO TBEC
40.00 mg | DELAYED_RELEASE_TABLET | Freq: Every day | ORAL | Status: AC
Start: 2013-11-22 — End: 2013-12-06

## 2013-11-22 MED ORDER — PANTOPRAZOLE SODIUM 40 MG PO TBEC
40.0000 mg | DELAYED_RELEASE_TABLET | Freq: Every day | ORAL | Status: DC
Start: 2013-11-23 — End: 2013-11-23
  Administered 2013-11-22: 40 mg via ORAL

## 2013-11-22 NOTE — Discharge Instructions (Signed)
Dores no peito (de origem não determinada)  (Chest Pain, Nonspecific)  Geralmente, é difícil fazer um diagnóstico específico para a causa de uma dor no peito. Há sempre a chance de que sua dor possa estar ligada a algo grave, como um ataque cardíaco ou coágulo sanguíneo no pulmão. Você deve fazer o acompanhamento com seu médico em busca de uma avaliação mais profunda.   CAUSES   · Azia.  · Pneumonia ou bronquite.  · Ansiedade ou stress.  · Inflamação em volta do coração (pericardite) ou dos pulmões (pleurite, ou pleurisia).  · Um coágulo no pulmão.  · Pulmão colapsado (pneumotórax). Isso pode surgir subitamente, por conta própria (um pneumotórax espontâneo) ou por algum trauma no peito.  · Infecção de herpes (vírus herpes zoster).  A parede do peito é composta de ossos, músculos e cartilagem. Qualquer uma das situações abaixo pode ser a origem da dor:   · Os ossos podem ser lesionados.  · Os músculos ou as cartilagens podem ser tensionados por tosse ou trabalho em excesso.  · A cartilagem também pode ver-se afetada por inflamação e começar a doer (costocondrite).  DIAGNÓSTICO  Os exames de laboratório ou outros estudos, como raios-X, eletrocardiografia, exame de estresse, ou imagem cardíaca, podem ser necessários para encontrar a causa de sua dor.   TRATAMENTO  · O tratamento dependerá da causa da dor no peito. O tratamento pode incluir:  · Bloqueadores contra a azia.  · Medicamentos antiinflamatórios.  · Medicamentos contra a dor para condições inflamatórias.  · Antibióticos em caso de infecção.  · Você pode ser aconselhado a alterar seus hábitos de estilo de vida. Parar de fumar e evitar o álcool, cafeína e chocolate.  · Você pode ser aconselhado a manter sua cabeça elevada quando dormir. Isso reduz as chances de ter ácido voltando do estômago ao esôfago.  · Na maioria dos casos, a dor no peito não específica melhora em 2 ou 3 dias apenas com repouso e remédios leves para dor.  INSTRUÇÕES PARA TRATAMENTO  DOMICILIAR  · Se antibióticos forem prescritos, tome-os conforme instruído. Termine de tomá-los mesmo caso já se sinta melhor.  · Durante alguns dias, evite atividades físicas que provoquem a dor. Continue as atividades físicas, como orientado.  · Não fume.  · Evite tomar bebidas alcoólicas.  · Só tome medicamentos comuns ou prescritos pelo médico para dor, desconforto ou febre, conforme orientado.  · Siga as orientações de seu médico para a realização de outros exames se o problema persistir.  · Mantenha quaisquer consultas de acompanhamento que tenha marcado. Se não comparecer às consultas, poderá desenvolver problemas duradouros (crônicos) com dor. Caso não possa comparecer à consulta, remarque-a.  PROCURE UM MÉDICO SE:  · Tiver problemas relacionados ao medicamento sendo tomado. Leia a bula com atenção.  · A dor no peito persistir mesmo depois de seguir os tratamentos recomendados.  · Aparecer erupções com bolhas no peito.  PROCURE UM MÉDICO IMEDIATAMENTE SE:  · A dor no peito aumentar ou se a dor se espalhar para os braços, o pescoço, a mandíbula, as costas ou o abdome.  · Sentir falta de ar, a tosse aumentar ou se tossir sangue.  · Sentir dor nas costas ou abdominal intensa, náusea ou vômitos.  · Você sentir uma fraqueza extrema, desmaios ou calafrios.  · Tiver febre.  ISSO É UMA EMERGÊNCIA. Não espere para ver se a dor vai passar. Procure ajuda médica imediatamente. Ligue para o serviço de emergência local (911 nos EUA). Não dirija até o   hospital.  ASSEGURE-SE DE QUE:   · Entendeu essas instruções.  · Controlará sua condição.  · Buscará ajuda imediata caso não se sinta bem ou seu estado piore.  Document Released: 08/17/2008 Document Revised: 11/27/2011  Exit[employer]® Patient Information ©2014 [employer].

## 2013-11-22 NOTE — ED Notes (Signed)
To xray now.

## 2013-11-22 NOTE — ED Notes (Signed)
Discomfort 2/10 scale.

## 2013-11-22 NOTE — ED Notes (Signed)
Provider into see.

## 2013-11-22 NOTE — ED Triage Note (Signed)
Cp after walking to bus from work.moderate  Pain lasting 15 min diff time to take a breath. Pain non rad. States 4/10 scale now.

## 2013-11-22 NOTE — ED Provider Notes (Signed)
The patient was seen primarily by me. ED nursing record was reviewed. Prior records as available electronically through the Epic record were reviewed.    HPI:    This is a 43 year old male hx HLD, GERD complaining of left-sided chest pain. Patient states he was sitting on the bus when the pain came on; described as sharp/knifelike, non-radiating, and worsening with deep breath. Pain lasted approx 20-30 minutes, no pain presently. He states he has had similar pain in the past, and documentation from his PMD states he used to take prilosec. Denies leg swelling, hx blood clots, f/c/n/v/c/d, denies cough. Flew from [place] in January, patient lives in [place], follows up at [facility].         ROS: Pertinent positives were reviewed as per the HPI above. All other systems were reviewed and are negative.      Past Medical History/Problem list:    Past Medical History    Hyperlipidemia      Patient Active Problem List:     Headache     Other and Unspecified Hyperlipidemia     Allergic Rhinitis, Cause Unspecified     Family History of Stroke (Cerebrovascular)     Sciatica     Esophageal reflux        Past Surgical History:     Past Surgical History    EXCISION/TRANSPOSITION PTERYGIUM W/O GRAFT  3/05    Comment Dr Nathaneil Canary         Medications:   No current facility-administered medications for this encounter.  Current Outpatient Prescriptions:  capsaicin (ZOSTRIX) 0.025 % cream Apply  topically 4 (four) times daily as needed. Disp: 60 g Rfl: 2   ibuprofen (ADVIL) 200 MG capsule Take 200 mg by mouth every 4 (four) hours as needed. Disp:  Rfl:    ASPIRIN 81 MG OR TABS OVER THE COUNTER; one a day, after meal Disp: 100 Rfl: 3         Social History:    Smoking status: Never Smoker     Smokeless tobacco: Not on file    Alcohol Use: No         Allergies:  Review of Patient's Allergies indicates:  No Known Allergies      Physical Exam:  BP 144/81   Pulse 84   Temp(Src) 98.1 F   Resp 18   Wt 65 kg (143 lb 4.8 oz)   BMI 23.85  kg/m2   SpO2 98%    GENERAL: No acute distress.   SKIN:  Warm & Dry, no rash.  HEAD: Atraumatic. PERRL. EOMI.  Oropharynx: clear.  NECK: No midline tenderness.  No LAN.   LUNGS:  Clear to auscultation bilaterally. No wheezes, rales, rhonchi.   HEART:  RRR.  No murmurs, rubs, or gallops.   ABDOMEN:  Soft, NTND.  No guarding or rebound tenderness.   MUSCULOSKELETAL:  No obvious deformities.  EXT: WWP, no c/c/e.  NEUROLOGIC: Alert and oriented.  Moves all extremities well. Sensation intact to light touch globally. CN II-XII grossly intact.  PSYCHIATRIC:  Appropriate for age, time of day, and situation        ED Course and Medical Decision-making:    The patient is a  43 year old male with hx reflux describing similar symptoms this evening. Patient with HLD as risk factor for ACS but he describes pain as characteristic of previous reflux episodes and pain description not concerning for ACS (non-exertional, pleuritic). EKG w/o STE or TWI in anatomic distribution. PERC  negative. No f/c/cough concerning for PNA & no infiltrate or PTX seen on CXR. Patient given GI cocktail & pain free; discussed plan with him to discharge with protonix & f/u with PMD; strict return precautions discussed. Patient stated he was comfortable discussing his care plan/disposition, return precautions, & follow-up plan in English and was appropriately conversant & communicative throughout; he verbalized understanding and agreement with the plan.       Condition: Improved and Stable    Disposition:  home    Diagnosis/Diagnoses:  Chest pain, non-cardiac            Jannette SpannerBaker Makyle Eslick, MD  [facility]

## 2013-11-23 LAB — XR CHEST 2 VIEWS

## 2013-11-24 LAB — EKG

## 2015-03-09 ENCOUNTER — Ambulatory Visit (HOSPITAL_BASED_OUTPATIENT_CLINIC_OR_DEPARTMENT_OTHER): Payer: PRIVATE HEALTH INSURANCE | Admitting: Family Medicine

## 2015-03-09 ENCOUNTER — Encounter (HOSPITAL_BASED_OUTPATIENT_CLINIC_OR_DEPARTMENT_OTHER): Payer: Self-pay | Admitting: Family Medicine

## 2015-03-09 VITALS — BP 108/69 | HR 73 | Temp 97.8°F | Wt 141.8 lb

## 2015-03-09 DIAGNOSIS — M79672 Pain in left foot: Secondary | ICD-10-CM

## 2015-03-09 DIAGNOSIS — R079 Chest pain, unspecified: Secondary | ICD-10-CM

## 2015-03-09 DIAGNOSIS — Z0000 Encounter for general adult medical examination without abnormal findings: Principal | ICD-10-CM

## 2015-03-09 DIAGNOSIS — M79671 Pain in right foot: Secondary | ICD-10-CM

## 2015-03-09 DIAGNOSIS — M25561 Pain in right knee: Secondary | ICD-10-CM

## 2015-03-09 DIAGNOSIS — M25562 Pain in left knee: Secondary | ICD-10-CM

## 2015-03-09 LAB — CBC, PLATELET & DIFFERENTIAL
ABSOLUTE BASO COUNT: 0 10*3/uL (ref 0.0–0.1)
ABSOLUTE EOSINOPHIL COUNT: 0.1 10*3/uL (ref 0.0–0.8)
ABSOLUTE IMM GRAN COUNT: 0.01 10*3/uL (ref 0.00–0.03)
ABSOLUTE LYMPH COUNT: 1.8 10*3/uL (ref 0.6–5.9)
ABSOLUTE MONO COUNT: 0.4 10*3/uL (ref 0.2–1.4)
ABSOLUTE NEUTROPHIL COUNT: 3.7 10*3/uL (ref 1.6–8.3)
BASOPHIL %: 0.5 % (ref 0.0–1.2)
EOSINOPHIL %: 1.2 % (ref 0.0–7.0)
HEMATOCRIT: 45.4 % (ref 40.1–51.0)
HEMOGLOBIN: 15.5 g/dL (ref 13.7–17.5)
IMMATURE GRANULOCYTE %: 0.2 % (ref 0.0–0.4)
LYMPHOCYTE %: 30.6 % (ref 15.0–54.0)
MEAN CORP HGB CONC: 34.1 g/dL (ref 31.0–37.0)
MEAN CORPUSCULAR HGB: 29.7 pg (ref 26.0–34.0)
MEAN CORPUSCULAR VOL: 87 fL (ref 80.0–100.0)
MEAN PLATELET VOLUME: 12.2 fL (ref 8.7–12.5)
MONOCYTE %: 6.4 % (ref 4.0–13.0)
NEUTROPHIL %: 61.1 % (ref 40.0–75.0)
PLATELET COUNT: 209 10*3/uL (ref 150–400)
RBC DISTRIBUTION WIDTH STD DEV: 40.2 fL (ref 35.1–46.3)
RBC DISTRIBUTION WIDTH: 12.5 % (ref 11.5–14.3)
RED BLOOD CELL COUNT: 5.22 M/uL (ref 4.60–6.10)
WHITE BLOOD CELL COUNT: 6 10*3/uL (ref 4.0–11.0)

## 2015-03-09 LAB — LIPID PANEL
Cholesterol: 228 mg/dL (ref 0–239)
HIGH DENSITY LIPOPROTEIN: 39 mg/dL — ABNORMAL LOW (ref 40–?)
LOW DENSITY LIPOPROTEIN DIRECT: 149 mg/dL (ref 0–189)
TRIGLYCERIDES: 159 mg/dL — ABNORMAL HIGH (ref 0–150)

## 2015-03-09 LAB — THYROID SCREEN TSH REFLEX FT4: THYROID SCREEN TSH REFLEX FT4: 0.816 u[IU]/mL (ref 0.358–3.740)

## 2015-03-09 NOTE — Progress Notes (Signed)
Jonathan [name] is a 44 year old male who presents for a physical exam and an episode of chest pain.     Chest pain - lasted 1 hour, substernal, nonradiating, was while at work chopping food. Felt like pressure. He was worried he was going to die. No SOB, sweats, nausea, dizziness, syncope.  No smoking, no fhx of early CHD, no HTN, no DM.    Patient Active Problem List:     Headache(784.0)     Other and unspecified hyperlipidemia     Allergic rhinitis, cause unspecified     Family history of stroke (cerebrovascular)     Sciatica     Esophageal reflux        Current Outpatient Prescriptions:  capsaicin (ZOSTRIX) 0.025 % cream Apply  topically 4 (four) times daily as needed. Disp: 60 g Rfl: 2   ibuprofen (ADVIL) 200 MG capsule Take 200 mg by mouth every 4 (four) hours as needed. Disp:  Rfl:    ASPIRIN 81 MG OR TABS OVER THE COUNTER; one a day, after meal Disp: 100 Rfl: 3     No current facility-administered medications for this visit.     Allergies:  Review of Patient's Allergies indicates:  No Known Allergies    Health Maintenance:  AWQ Questionnaire due on 07/24/1987  TDAP/TD VACCINE(1 - Tdap) due on 05/27/2003  PHYSICAL EXAM (AGE 47-49) due on 11/03/2012  HEALTH CARE PROXY due on 06/08/2016  LIPID SCREENING due on 01/27/2018  HIV SCREENING Completed  HEP B HIGH RISK VACCINE EVAL (ONCE) Completed    Immunizations:  Immunization History   Administered Date(s) Administered    HEP B ADULT 3 DOSE 20 and > 05/28/2003, 07/01/2003, 12/10/2003    PPD 05/26/2003    Td 05/26/2003       Histories:    Past Medical History    Hyperlipidemia        Past Surgical History    EXCISION/TRANSPOSITION PTERYGIUM W/O GRAFT  3/05    Comment Dr Nathaneil Canary       Social History   Marital status: Single  Spouse name: N/A    Years of education: N/A  Number of children: N/A     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; 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, family     Social History Main Topics   Smoking status: Never Smoker    Smokeless tobacco: Not on  file    Alcohol use No    Drug use: No    Sexual activity: No    Comment: not sexually active     Other Topics Concern   None on file     Social History Narrative    Working: Investment banker, operational       Family History    Stroke Father     Comment: 55    GI Mother     Comment: died 18 after ?GI surgery       Review of Systems:                   Skin: negative  Eyes: negative  Ears/Nose/Throat: negative  Respiratory: negative  Cardiovascular: negative  Gastrointestinal: negative  Genitourinary: negative  Musculoskeletal: b/l knee pain, b/l heel pain  Neurologic: negative  Endocrine: negative  Psychiatric: negative  Hematologic/Lymphatic/Immunologic: negative    Physical:  BP 108/69  Pulse 73  Temp 97.8 F (36.6 C) (Temporal)  Wt 64.3 kg (141 lb 12.8 oz)  SpO2 98%  BMI 23.6 kg/m2  General appearance: healthy, alert,  well developed, well nourished  Eyes: conjunctivae/corneas clear. PERRL, EOM's intact. Fundi benign  Skin: skin color, texture, turgor are normal  Head: Normocephalic. No masses, lesions, tenderness or abnormalities  Ears: External ears normal. Canals clear. TM's normal.  Nose/Sinuses: Nares normal. Septum midline. Mucosa normal. No drainage or sinus tenderness.  Oropharynx: Lips, mucosa, and tongue normal. Teeth and gums normal. Oropharynx moist and without lesion  Neck: Neck supple. No adenopathy. Thyroid symmetric, normal size, and without nodularity  Back: Back symmetric, no curvature. ROM normal. No CVA tenderness.  Lungs: Percussion normal. Good diaphragmatic excursion. Lungs clear to auscultation bilaterally  Heart: PMI normal. No lifts, heaves, or thrills. RRR. No murmurs, clicks, gallops or rubs  Abdomen: Abdomen soft, non-tender. BS normal. No masses, no organomegaly  Extremities: Extremities normal. No deformities, edema, or skin discoloration  Musculoskeletal: Spine ROM normal. Muscular strength intact.  Peripheral pulses: radial=4/4, femoral=4/4, popliteal=4/4, dorsalis pedis=4/4  Neuro: Gait normal. Reflexes  normal and symmetric. Sensation grossly normal       ASSESSMENT/PLAN:  1. Routine general medical examination at a health care facility  Generally healthy adult man. Anticipatory guidance as appropriate for age/health status.   - LIPID PANEL    2. Bilateral knee pain  Desires PT referral  - REFERRAL TO PHYSICAL THERAPY ( INT)    3. Chest pain, unspecified chest pain type  Single episode of typical chest pain however no risk factors. Will send for ETT  - Thyroid Screen TSH Reflex FT4  - CBC+Plt with Diff  - Hemoglobin A1c  - REFERRAL TO CARDIO-PULMONARY LAB ( INT)    4. Heel pain, bilateral  Recommended gel pads and stretching

## 2015-03-10 ENCOUNTER — Encounter (HOSPITAL_BASED_OUTPATIENT_CLINIC_OR_DEPARTMENT_OTHER): Payer: Self-pay | Admitting: Family Medicine

## 2015-03-10 LAB — HEMOGLOBIN A1C
ESTIMATED AVERAGE GLUCOSE: 111 (ref 74–160)
HEMOGLOBIN A1C: 5.5 % (ref 4.0–5.6)

## 2015-05-04 ENCOUNTER — Ambulatory Visit
Admit: 2015-05-04 | Discharge: 2015-05-04 | Disposition: A | Discharge status: Home / Self Care | Payer: Self-pay | Source: Ambulatory Visit | Attending: Family Medicine | Admitting: Family Medicine

## 2015-05-04 ENCOUNTER — Other Ambulatory Visit (HOSPITAL_BASED_OUTPATIENT_CLINIC_OR_DEPARTMENT_OTHER): Payer: Self-pay | Admitting: Physician Assistant

## 2015-05-04 DIAGNOSIS — R42 Dizziness and giddiness: Secondary | ICD-10-CM

## 2015-05-04 DIAGNOSIS — R55 Syncope and collapse: Secondary | ICD-10-CM

## 2015-05-04 DIAGNOSIS — Z823 Family history of stroke: Principal | ICD-10-CM

## 2015-05-04 LAB — ECHOCARDIOGRAM W/ DOPPLER

## 2015-05-04 NOTE — Progress Notes (Signed)
Pt for ETT tol. Well, no cp/dyspnea, vss during test; immediately into recovery pt. C/o dizziness, weakness, blurry vision, slightly pale-B/P down quickly to 120/70 from 150/82 and HR from 188 to 120, pt. Lying supine-denied chest pain, tightness, or heaviness; no difficulty breathing-Sats 98-96%RA;   at 3:00 minute check still pale but alert and conversing with this RN-B/P 90/60 HR=60-80 RR=18 po fluids800cc given and pt. Kept supine;   06:00 check B/P up to 108/60, HR=70 dizziness gone and pt. States he feels much better-no blurry vision, color wnl; Loma Fraser aware and ordered cardiac ECHO; pt. dc'd from stress lab in no acute distress-B/P=110/70 HR=76

## 2015-05-06 ENCOUNTER — Encounter (HOSPITAL_BASED_OUTPATIENT_CLINIC_OR_DEPARTMENT_OTHER): Payer: Self-pay | Admitting: Family Medicine

## 2015-05-10 LAB — CARDIAC STRESS TEST: EXERCISE/TREADMILL

## 2015-10-26 ENCOUNTER — Telehealth (HOSPITAL_BASED_OUTPATIENT_CLINIC_OR_DEPARTMENT_OTHER): Payer: Self-pay | Admitting: Family Medicine

## 2015-10-26 ENCOUNTER — Encounter (HOSPITAL_BASED_OUTPATIENT_CLINIC_OR_DEPARTMENT_OTHER): Payer: Self-pay | Admitting: Family Medicine

## 2015-10-26 ENCOUNTER — Ambulatory Visit (HOSPITAL_BASED_OUTPATIENT_CLINIC_OR_DEPARTMENT_OTHER): Payer: PRIVATE HEALTH INSURANCE | Admitting: Family Medicine

## 2015-10-26 VITALS — BP 120/81 | HR 66 | Temp 98.1°F | Wt 144.0 lb

## 2015-10-26 DIAGNOSIS — R1011 Right upper quadrant pain: Secondary | ICD-10-CM

## 2015-10-26 MED ORDER — IBUPROFEN 600 MG PO TABS
600.00 mg | ORAL_TABLET | Freq: Four times a day (QID) | ORAL | 0 refills | Status: AC | PRN
Start: 2015-10-26 — End: 2015-11-25

## 2015-10-26 NOTE — Progress Notes (Signed)
Esmie from [facility] contacted the Central Refill Department to complete a benefit analysis for the Tdap Vaccine.    The vaccine is covered under the patient’s prescription coverage.    Please choose 90715.2 Tdap (Prior Auth/Pharmacy) and notify Central Refill via cc’d chart once the vaccine has been administered.

## 2015-10-26 NOTE — Progress Notes (Signed)
CC: abd pain  R sided abd pain x 1 month  Comes and goes  5-6/10  Twisting hurts, especially at night  Food doesn't affect  No radiation   BMs are regular/soft  No nausea/vomiting or heartburn unless eats spicy food  No hematuria/dysuria  ROS see above    Patient Active Problem List:     Headache(784.0)     Other and unspecified hyperlipidemia     Allergic rhinitis, cause unspecified     Family history of stroke (cerebrovascular)     Sciatica     Esophageal reflux        Current Outpatient Prescriptions on File Prior to Visit:  capsaicin (ZOSTRIX) 0.025 % cream Apply  topically 4 (four) times daily as needed. Disp: 60 g Rfl: 2   ASPIRIN 81 MG OR TABS OVER THE COUNTER; one a day, after meal Disp: 100 Rfl: 3     No current facility-administered medications on file prior to visit.     Review of Patient's Allergies indicates:  No Known Allergies    BP 120/81  Pulse 66  Temp 98.1 F (36.7 C) (Temporal)  Wt 65.3 kg (144 lb)  SpO2 97%  BMI 23.96 kg/m2    O: NAD  HEENT: atraumatic, no icterus, OP clear  Skin: no lesions or rashes  CTA bilat  nl S1/S2, no murmur  Abd: +BS, soft, TTP RUQ +murphy's sign  Ext: no edema or clubbing      A/P:  1. RUQ pain  History sounds MSK but with +murphy's sign. Will treat with NSAIDs, order ultrasound. If pain resolves with NSAIDs can defer ultrasound.  - US Abdomen Complete; Future      We discussed the patients current medications. The patient expressed understanding and no barriers to adherence were identified.

## 2015-11-03 ENCOUNTER — Ambulatory Visit: Payer: Self-pay | Admitting: Family Medicine

## 2015-11-03 ENCOUNTER — Telehealth (HOSPITAL_BASED_OUTPATIENT_CLINIC_OR_DEPARTMENT_OTHER): Payer: Self-pay | Admitting: Family Medicine

## 2015-11-03 DIAGNOSIS — R1011 Right upper quadrant pain: Secondary | ICD-10-CM

## 2015-11-03 LAB — US ABDOMEN COMPLETE

## 2015-11-03 NOTE — Addendum Note (Signed)
Addended by: Sanjuana Letters on: 11/03/2015 08:41 AM     Modules accepted: Orders

## 2015-11-03 NOTE — Progress Notes (Signed)
Spoke with patient via interpreter and advised that u/s was normal. Explained that likely msk pain and should resolve over a few days. Patient has been taking NSAIDs but not every 6 hours, he found them irritating to his stomach. Encouraged to take at least every 8 hours with food.   Encouraged to call back clinic if pain not improved by next week or sooner if it worsens or develops new symptoms such as fever, nausea/vomiting, pain radiates elsewhere.  Pt verbalized understanding

## 2015-11-03 NOTE — Progress Notes (Signed)
Left message for patient via interpreter for patient to call back nurse

## 2015-11-03 NOTE — Telephone Encounter (Signed)
-----   Message from Fowler sent at 11/03/2015  1:57 PM EST -----  Regarding: FW: returning phone call for [place], [place]   Contact: [phone]      ----- Message -----     From: Lane Hacker     Sent: 11/03/2015   1:47 PM       To: Wynelle Link Rn's  Subject: returning phone call for [place], RN             Jonathan [name] [id number], 45 year old, male    Calls today:  Clinical Questions (NON-SICK CLINICAL QUESTIONS ONLY)    Name of person calling [name]  Specific nature of request returning phone call for [name], RN regarding [place] results   Return phone number [phone]  Person calling on behalf of patient: Patient (self)    Cleotis Lema NUMBER: [phone]  Best time to call back: anytime  Cell phone:   Other phone:    Patient's language of care: Tong[place]    Patient needs a Tong[place] interpreter.    Patient's PCP: Lawana Chambers, MD, MD

## 2015-11-03 NOTE — Progress Notes (Signed)
Please call patient - normal ultrasound. Ask if NSAIDs are helping with his pain. If not, can offer return visit   Thanks   R (Routing comment)

## 2015-11-23 ENCOUNTER — Ambulatory Visit (HOSPITAL_BASED_OUTPATIENT_CLINIC_OR_DEPARTMENT_OTHER): Payer: No Typology Code available for payment source | Admitting: Family Medicine

## 2015-11-23 VITALS — BP 129/84 | HR 72 | Temp 97.7°F | Ht 65.5 in | Wt 147.6 lb

## 2015-11-23 DIAGNOSIS — K5900 Constipation, unspecified: Principal | ICD-10-CM

## 2015-11-23 DIAGNOSIS — R1030 Lower abdominal pain, unspecified: Secondary | ICD-10-CM

## 2015-11-23 MED ORDER — CAPSAICIN 0.025 % EX CREA
TOPICAL_CREAM | Freq: Four times a day (QID) | CUTANEOUS | 2 refills | Status: DC | PRN
Start: 2015-11-23 — End: 2016-12-05

## 2015-11-23 MED ORDER — PSYLLIUM 33 % PO POWD
1.0000 | Freq: Every day | ORAL | 0 refills | Status: AC
Start: 2015-11-23 — End: 2015-12-23

## 2015-11-23 MED ORDER — PSYLLIUM 33 % PO POWD
1.0000 | Freq: Every day | ORAL | 0 refills | Status: DC
Start: 2015-11-23 — End: 2015-11-23

## 2015-11-23 MED ORDER — CAPSAICIN 0.025 % EX CREA
TOPICAL_CREAM | Freq: Four times a day (QID) | CUTANEOUS | 2 refills | Status: DC | PRN
Start: 2015-11-23 — End: 2015-11-23

## 2015-11-23 NOTE — Progress Notes (Signed)
Subjective     Jonathan [name] is a 45 year old male presents with complaint of abdominal pain;  Pt was seen for pain on the right side a month ago;  He had an abd US which was normal;  He reports abd pain x 2 weeks;  Worried about appendicitis;  He now has pain over LLQ;  Wonders if it's in his mind;  Has had constipation and some blood on tissue paper from straining x one episode;  He reports motrin helps with his stomach pain;  Also having some back pain which motrin helps for as well;    Also reports increased hear rate while in waiting room today no pain; wants to know if that's normal;    Pt reports fever but has not measured temp, states he feels heat in the head;  Denies vomiting, nausea, diarrhea; food does not affect sxs;  sxs intermittent;      Social History    Marital status: Single              Spouse name:                       Years of education:                 Number of children:               Occupational History  Occupation          Loss adjuster, charteredmployer            Comment               cook                [name]          In [place]; lives w/                                          3 roommates, family    Social History Main Topics    Smoking status: Never Smoker                                                                Alcohol use: No              Drug use: No              Sexual activity: No                      Comment: not sexually active    Social History Narrative    Working: Investment banker, operationalchef - cold station at [employer]        Single, no children        No smoking    ETOH - once a month    No drugs        Lives with brother and sister-in-law and 2 children          Patient Active Problem List:     Headache(784.0)     Other and unspecified hyperlipidemia     Allergic rhinitis, cause unspecified     Family history of stroke (  cerebrovascular)     Sciatica     Esophageal reflux      Past Surgical History    EXCISION/TRANSPOSITION PTERYGIUM W/O GRAFT  3/05    Comment Dr Nathaneil Canary       Family History    Stroke  Father     Comment: 71    GI Mother     Comment: died 20 after ?GI surgery    Blood Disease Brother     Comment: not sure what        Current Outpatient Prescriptions:  ibuprofen (ADVIL,MOTRIN) 600 MG tablet Take 1 tablet by mouth every 6 (six) hours as needed for Pain Disp: 90 tablet Rfl: 0   capsaicin (ZOSTRIX) 0.025 % cream Apply  topically 4 (four) times daily as needed. Disp: 60 g Rfl: 2   ASPIRIN 81 MG OR TABS OVER THE COUNTER; one a day, after meal Disp: 100 Rfl: 3     No current facility-administered medications for this visit.   Review of Patient's Allergies indicates:  No Known Allergies           Objective     BP 129/84  Pulse 72  Temp 97.7 F (36.5 C) (Temporal)  Ht 5' 5.5" (1.664 m)  Wt 67 kg (147 lb 9.6 oz)  SpO2 98%  BMI 24.19 kg/m2  O: NAD  HEENT: atraumatic, no icterus, OP clear   Skin: no lesions or rash  Cardiac: nl S1/S2, no murmurs, rubs, gallops  Abdomin: soft, NT/ND;BS normoactive; no guarding or rebound; without hepatosplenomegaly  Ext: no edema or clubbing         Assessment   Problem List Items Addressed This Visit     None                Plan    I have spent 25 minutes in face to face time with this patient/patient proxy of which > 50% was in counseling or coordination of care regarding :  (K59.00) Constipation, unspecified constipation type  (primary encounter diagnosis)(R10.30) Lower abdominal pain  Comment: pt presents with ongoing intermittent lower abdominal pain; he has had normal abd [place] which was ordered given positive murphy's test;  Pt had no pain on abdominal exam today; no rebound or guarding; I explained low suspicion for appendicitis; suspect sxs due to constipation;  Suggested increased fluids and fiber; considered miralax however stool a little better today;    Reassured pt pulse normal and heart sounds were normal; explained anxiety can cause increased HR;    Strict return and emergent precautions discussed. Pt. iterates understanding and all questions answered.         No  orders of the defined types were placed in this encounter.        Shanasia Ibrahim PA-C

## 2015-12-21 ENCOUNTER — Encounter (HOSPITAL_BASED_OUTPATIENT_CLINIC_OR_DEPARTMENT_OTHER): Payer: Self-pay | Admitting: Family Medicine

## 2015-12-21 ENCOUNTER — Ambulatory Visit (HOSPITAL_BASED_OUTPATIENT_CLINIC_OR_DEPARTMENT_OTHER): Payer: No Typology Code available for payment source | Admitting: Family Medicine

## 2015-12-21 VITALS — BP 137/89 | HR 59 | Temp 98.0°F | Wt 147.4 lb

## 2015-12-21 DIAGNOSIS — R1030 Lower abdominal pain, unspecified: Principal | ICD-10-CM

## 2015-12-21 LAB — CBC, PLATELET & DIFFERENTIAL
ABSOLUTE BASO COUNT: 0 10*3/uL (ref 0.0–0.1)
ABSOLUTE EOSINOPHIL COUNT: 0.1 10*3/uL (ref 0.0–0.8)
ABSOLUTE IMM GRAN COUNT: 0.01 10*3/uL (ref 0.00–0.03)
ABSOLUTE LYMPH COUNT: 1.8 10*3/uL (ref 0.6–5.9)
ABSOLUTE MONO COUNT: 0.5 10*3/uL (ref 0.2–1.4)
ABSOLUTE NEUTROPHIL COUNT: 3.9 10*3/uL (ref 1.6–8.3)
BASOPHIL %: 0.3 % (ref 0.0–1.2)
EOSINOPHIL %: 1.9 % (ref 0.0–7.0)
HEMATOCRIT: 44.6 % (ref 40.1–51.0)
HEMOGLOBIN: 15.3 g/dL (ref 13.7–17.5)
IMMATURE GRANULOCYTE %: 0.2 % (ref 0.0–0.4)
LYMPHOCYTE %: 28.6 % (ref 15.0–54.0)
MEAN CORP HGB CONC: 34.3 g/dL (ref 31.0–37.0)
MEAN CORPUSCULAR HGB: 29.8 pg (ref 26.0–34.0)
MEAN CORPUSCULAR VOL: 86.9 fL (ref 80.0–100.0)
MEAN PLATELET VOLUME: 12 fL (ref 8.7–12.5)
MONOCYTE %: 7.5 % (ref 4.0–13.0)
NEUTROPHIL %: 61.5 % (ref 40.0–75.0)
PLATELET COUNT: 226 10*3/uL (ref 150–400)
RBC DISTRIBUTION WIDTH STD DEV: 40.3 fL (ref 35.1–46.3)
RBC DISTRIBUTION WIDTH: 12.7 % (ref 11.5–14.3)
RED BLOOD CELL COUNT: 5.13 M/uL (ref 4.60–6.10)
WHITE BLOOD CELL COUNT: 6.4 10*3/uL (ref 4.0–11.0)

## 2015-12-21 NOTE — Progress Notes (Signed)
CC: continued abd pain  Jonathan [name] is a 45 year old male complains of continued abdominal pain, had episode last week lasting over an hour in the lower abdomen (LLQ and RLQ), got better with ibuprofen.  Saw Juliane last month who thought pain was due to constipation, Rx psyllium. Pt not taking psyllium though tries to drink lots of water. He has at least one BM/day, is usually normal but sometimes dry when he eats red meat.  Denies blood in stool or darkened stool, denies vomiting/diarrhea.  ROS: see above    Patient Active Problem List:     Headache(784.0)     Other and unspecified hyperlipidemia     Allergic rhinitis, cause unspecified     Family history of stroke (cerebrovascular)     Sciatica     Esophageal reflux        Current Outpatient Prescriptions on File Prior to Visit:  psyllium (KONSYL) 33 % POWD Take 1 packet by mouth daily Mix with 8 ounces of beverage Disp: 30 packet Rfl: 0   capsaicin (ZOSTRIX) 0.025 % cream Apply topically 4 (four) times daily as needed Disp: 60 g Rfl: 2   ASPIRIN 81 MG OR TABS OVER THE COUNTER; one a day, after meal Disp: 100 Rfl: 3     No current facility-administered medications on file prior to visit.     Review of Patient's Allergies indicates:  No Known Allergies    BP 137/89   Pulse 59   Temp 98 F (36.7 C) (Temporal)   Wt 66.9 kg (147 lb 6.4 oz)   SpO2 97%   BMI 24.16 kg/m2    O: NAD  HEENT: atraumatic, no icterus, OP clear  Skin: no lesions or rashes  CTA bilat  nl S1/S2, no murmur\  Abd: soft, nontender nondistended  Ext: no edema or clubbing      A/P:  1. Lower abdominal pain  Unclear etiology, might still be MSK or constipation. Will expand workup with labs and KUB, contact patient with results.  - CBC+Plt with Diff  - Comprehensive Metabolic Panel  - Helicobacter Pylori Stool Antigen; Future  - Schistosomal Ab IgG  - Strongyloides Ab IgG  - ROUTINE VENIPUNCTURE  - XR Abdomen (KUB) W Upright &/or Decubitus; Future  - Thyroid Screen TSH Reflex FT4  Future      We discussed the patients current medications. The patient expressed understanding and no barriers to adherence were identified.

## 2015-12-27 ENCOUNTER — Telehealth (HOSPITAL_BASED_OUTPATIENT_CLINIC_OR_DEPARTMENT_OTHER): Payer: Self-pay | Admitting: Family Medicine

## 2015-12-27 DIAGNOSIS — B659 Schistosomiasis, unspecified: Principal | ICD-10-CM

## 2015-12-27 LAB — COMPREHENSIVE METABOLIC PANEL
ALANINE AMINOTRANSFERASE: 29 U/L (ref 12–45)
ALBUMIN: 4.2 g/dL (ref 3.4–5.0)
ALKALINE PHOSPHATASE: 78 U/L (ref 45–117)
ANION GAP: 5 mmol/L (ref 5–15)
ASPARTATE AMINOTRANSFERASE: 20 U/L (ref 8–34)
BILIRUBIN TOTAL: 0.5 mg/dL (ref 0.2–1.0)
BUN (UREA NITROGEN): 15 mg/dL (ref 7–18)
CALCIUM: 8.9 mg/dL (ref 8.5–10.1)
CARBON DIOXIDE: 30 mmol/L (ref 21–32)
CHLORIDE: 105 mmol/L (ref 98–107)
CREATININE: 0.7 mg/dL (ref 0.7–1.2)
ESTIMATED GLOMERULAR FILT RATE: >60 mL/min (ref >60)
Glucose Random: 86 mg/dL (ref 74–160)
POTASSIUM: 4.1 mmol/L (ref 3.5–5.1)
SODIUM: 140 mmol/L (ref 136–145)
TOTAL PROTEIN: 6.9 g/dL (ref 6.4–8.2)

## 2015-12-27 LAB — STRONGYLOIDES AB IGG: STRONGYLOIDES AB IgG: 3.11 {index} (ref 0.00–9.99)

## 2015-12-27 LAB — SCHISTOSOMAL AB IGG: SCHISTOSOMAL AB IgG: 61.54 {index} — ABNORMAL HIGH (ref 0.00–8.99)

## 2015-12-27 LAB — THYROID SCREEN TSH REFLEX FT4: THYROID SCREEN TSH REFLEX FT4: 0.51 u[IU]/mL (ref 0.358–3.740)

## 2015-12-27 MED ORDER — PRAZIQUANTEL 600 MG PO TABS
20.00 mg/kg | ORAL_TABLET | Freq: Three times a day (TID) | ORAL | 0 refills | Status: AC
Start: 2015-12-27 — End: 2015-12-28

## 2015-12-27 NOTE — Progress Notes (Signed)
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 Rx praziquantel 2 tabs TID x 1 day   Thanks   RG

## 2015-12-27 NOTE — Progress Notes (Signed)
Left message for patient to call back nurse.

## 2015-12-28 ENCOUNTER — Encounter (HOSPITAL_BASED_OUTPATIENT_CLINIC_OR_DEPARTMENT_OTHER): Payer: Self-pay | Admitting: Family Medicine

## 2015-12-28 ENCOUNTER — Ambulatory Visit (HOSPITAL_BASED_OUTPATIENT_CLINIC_OR_DEPARTMENT_OTHER): Payer: No Typology Code available for payment source | Admitting: Lab

## 2015-12-28 DIAGNOSIS — R1030 Lower abdominal pain, unspecified: Secondary | ICD-10-CM

## 2015-12-28 LAB — POC IMMUNOASSAY FECAL OCCULT BLOOD TEST: POC FECAL OCCULT BLOOD TEST (IMMUNOASSAY): NEGATIVE

## 2015-12-28 LAB — HELICOBACTER PYLORI STOOL AG

## 2015-12-28 NOTE — Progress Notes (Signed)
Spoke with pt via interpreter and advised of plan- can do x-ray now or wait until completion of schisto treatment and if still having pain to do x-ray then. Pt plans to wait until after treatment.  Explained he does not need to schedule x-ray, can walk in for radiology

## 2015-12-28 NOTE — Progress Notes (Signed)
Patient presents to clinic for lab work and asks to speak to RN re: voicemail he received.    Discussed schisto results as outlined below and reviewed praziquantel prescription    Also discussed normal iFOB    Patient would like to schedule XR Abdomen (KUB) W Upright &/or Decubitus; Team nurse will call him to f/u.

## 2015-12-28 NOTE — Progress Notes (Signed)
True, does not need to be scheduled. He could probably hold off for 1-2 weeks and see how he is feeling after the schisto treatment. I don't feel strongly either way   -R

## 2015-12-28 NOTE — Progress Notes (Signed)
Specimen collected

## 2016-01-14 ENCOUNTER — Telehealth (HOSPITAL_BASED_OUTPATIENT_CLINIC_OR_DEPARTMENT_OTHER): Payer: Self-pay | Admitting: Family Medicine

## 2016-01-14 NOTE — Progress Notes (Unsigned)
ESTHER from [facility] called the Central Refill Department to complete a benefit analysis for the TDAP Vaccine.    The vaccine is covered under the patient’s prescription coverage.    Please choose 90715.2 TDAP (Prior Auth/Pharmacy) and notify Central Refill via cc’d chart once the vaccine has been administered.

## 2016-01-17 ENCOUNTER — Encounter (HOSPITAL_BASED_OUTPATIENT_CLINIC_OR_DEPARTMENT_OTHER): Payer: Self-pay | Admitting: Family Medicine

## 2016-01-17 ENCOUNTER — Ambulatory Visit (HOSPITAL_BASED_OUTPATIENT_CLINIC_OR_DEPARTMENT_OTHER): Payer: No Typology Code available for payment source | Admitting: Family Medicine

## 2016-01-17 VITALS — BP 124/80 | HR 79 | Temp 98.2°F | Wt 146.6 lb

## 2016-01-17 DIAGNOSIS — R1030 Lower abdominal pain, unspecified: Secondary | ICD-10-CM

## 2016-01-17 NOTE — Progress Notes (Signed)
CC: abd pain    Rodert Helaine ChessRoza is a 45 year old male with continued migratory abdominal pain. Still has not taken psyllium as previously recommended and has not gotten KUB - plans to do tomorrow. Currently mostly in RLQ although yesterday was in LLQ and radiates to back.  Has 1-2 BMs/day  Recently lab workup showed +schisto and was treated with praziquantel. Per pt felt better for about 2 days and pain came back  ROS: no fevers/chills    Patient Active Problem List:     Headache(784.0)     Other and unspecified hyperlipidemia     Allergic rhinitis, cause unspecified     Family history of stroke (cerebrovascular)     Sciatica     Esophageal reflux        Current Outpatient Prescriptions on File Prior to Visit:  capsaicin (ZOSTRIX) 0.025 % cream Apply topically 4 (four) times daily as needed Disp: 60 g Rfl: 2     No current facility-administered medications on file prior to visit.     Review of Patient's Allergies indicates:  No Known Allergies    BP 124/80   Pulse 79   Temp 98.2 F (36.8 C) (Temporal)   Wt 66.5 kg (146 lb 9.6 oz)   SpO2 96%   BMI 24.02 kg/m2    O: NAD  HEENT: atraumatic, no icterus, OP clear  Skin: no lesions or rashes  CTA bilat  nl S1/S2, no murmur  abd soft, nontender  Ext: no edema or clubbing      A/P:  1. Lower abdominal pain  I suspect pt is constipated - would recommend getting KUB to confirm and also would start psyllium. If KUB shows nothing I will refer to GI     We discussed the patients current medications. The patient expressed understanding and no barriers to adherence were identified.

## 2016-01-18 ENCOUNTER — Other Ambulatory Visit (HOSPITAL_BASED_OUTPATIENT_CLINIC_OR_DEPARTMENT_OTHER): Payer: Self-pay

## 2016-01-18 NOTE — Progress Notes (Signed)
PER Patient (self), Jonathan [name] is a 45 year old male has requested a refill of Biltricide 600mg .      Last Office Visit: 01/17/2016     With: Quenton FetterGumpert, Richard  Last Physical Exam: 03/09/2015      Other Med Adult:  Most Recent BP Reading(s)  01/17/16 : 124/80          Cholesterol (mg/dL)   Date Value   16/60/630106/21/2016 228   ----------    LOW DENSITY LIPOPROTEIN DIRECT (mg/dL)   Date Value   60/10/932306/21/2016 149   ----------    HIGH DENSITY LIPOPROTEIN (mg/dL)   Date Value   55/73/220206/21/2016 39 (L)   ----------    TRIGLYCERIDES (mg/dL)   Date Value   54/27/062306/21/2016 159 (H)   ----------        THYROID SCREEN TSH REFLEX FT4 (uIU/mL)   Date Value   12/21/2015 0.510   ----------      No results found for: TSH      HEMOGLOBIN A1C (%)   Date Value   03/09/2015 5.5   ----------      No results found for: INR      SODIUM (mmol/L)   Date Value   12/21/2015 140   ----------      POTASSIUM (mmol/L)   Date Value   12/21/2015 4.1   ----------          CREATININE (mg/dL)   Date Value   76/28/315104/12/2015 0.7   ----------    Documented patient preferred pharmacies:      [facility]  Phone: [phone] Fax: [phone]

## 2016-01-18 NOTE — Progress Notes (Signed)
Trisha Mangledemir Greff [phone], 45 year old, male    Calls today:  Refill    !! Before starting refill request, check EPIC to see if encounter for this medication already exists !!    (May list multiple medications in this section)  Medicine Name: praziquantel (BILTRICIDE) 600 MG tablet  Dosage:   Frequency (how many pills, how many times a day): Take 2 tablets by mouth 3 (three) times daily - Oral  Number of pills left:     Documented patient preferred pharmacies:    [facility]  Phone: [phone] Fax: [phone]    Person calling on behalf of patient: Patient (self)    CALL BACK NUMBER: [phone]    Patient's language of care: TongaPortuguese    Patient needs a TongaPortuguese interpreter.    Patient's PCP: [name], MD, MD

## 2016-01-25 ENCOUNTER — Ambulatory Visit (HOSPITAL_BASED_OUTPATIENT_CLINIC_OR_DEPARTMENT_OTHER): Payer: Self-pay | Admitting: Family Medicine

## 2016-01-25 DIAGNOSIS — R1030 Lower abdominal pain, unspecified: Principal | ICD-10-CM

## 2016-01-25 LAB — XR ABDOMEN (KUB) WITH UPRIGHT AND OR DECUBITUS

## 2016-01-25 NOTE — Addendum Note (Signed)
Addended by: Estevan RyderJONES, YVETTE on: 01/25/2016 11:48 AM     Modules accepted: Orders

## 2016-01-27 ENCOUNTER — Telehealth (HOSPITAL_BASED_OUTPATIENT_CLINIC_OR_DEPARTMENT_OTHER): Payer: Self-pay | Admitting: Family Medicine

## 2016-01-27 MED ORDER — POLYETHYLENE GLYCOL 3350 17 G PO PACK
17.0000 g | PACK | Freq: Every day | ORAL | 0 refills | Status: DC
Start: 2016-01-27 — End: 2016-03-07

## 2016-01-27 NOTE — Progress Notes (Signed)
Call via TongaPortuguese telephone interpreter  Called with negative abdominal xray result   Unable to reach the patient  Telephone message with the clinic telephone number left for the patient to call to discuss the result and the plan per Dr. Aleda GranaGumpert note below:      Please call patient with x-ray results - he is indeed "backed up" with stool. Would advise taking miralax - 1 dose per day, with extra dose if does not have large BM   Thanks,   RG (Routing comment)

## 2016-01-28 NOTE — Progress Notes (Signed)
2nd telephone message left via TongaPortuguese telephone interpreter  Patient encouraged to call to discuss his xray result

## 2016-01-31 ENCOUNTER — Ambulatory Visit (HOSPITAL_BASED_OUTPATIENT_CLINIC_OR_DEPARTMENT_OTHER): Payer: No Typology Code available for payment source | Admitting: Family Medicine

## 2016-01-31 ENCOUNTER — Encounter (HOSPITAL_BASED_OUTPATIENT_CLINIC_OR_DEPARTMENT_OTHER): Payer: Self-pay | Admitting: Family Medicine

## 2016-01-31 VITALS — BP 111/75 | HR 76 | Temp 97.6°F | Wt 145.2 lb

## 2016-01-31 DIAGNOSIS — K5900 Constipation, unspecified: Secondary | ICD-10-CM

## 2016-01-31 NOTE — Progress Notes (Signed)
CC: f/u x-ray results  Idrissa Helaine ChessRoza is a 45 year old male with continued abdominal pain. Denies constipation. Here to review recent x-ray which shows excessive stool burden, particularly in the RLQ which is where most of his pain is.  ROS: +subjective fevers.    Patient Active Problem List:     Headache(784.0)     Other and unspecified hyperlipidemia     Allergic rhinitis, cause unspecified     Family history of stroke (cerebrovascular)     Sciatica     Esophageal reflux        Current Outpatient Prescriptions on File Prior to Visit:  polyethylene glycol (GLYCOLAX/MIRALAX) packet Take 1 packet by mouth daily Disp: 10 Package Rfl: 0   capsaicin (ZOSTRIX) 0.025 % cream Apply topically 4 (four) times daily as needed Disp: 60 g Rfl: 2     No current facility-administered medications on file prior to visit.     Review of Patient's Allergies indicates:  No Known Allergies    BP 111/75   Pulse 76   Temp 97.6 F (36.4 C) (Temporal)   Wt 65.9 kg (145 lb 3.2 oz)   SpO2 97%   BMI 23.8 kg/m2    O: NAD  HEENT: atraumatic, no icterus, OP clear  Skin: no lesions or rashes  Breathing comfortably  Ext: no edema or clubbing      A/P:  1. Constipation, unspecified constipation type  Discussed this is probably long-standing. At this point recommend clean-out with miralax x 1 week. Discussed proper dosing.     We discussed the patients current medications. The patient expressed understanding and no barriers to adherence were identified.

## 2016-03-07 ENCOUNTER — Encounter (HOSPITAL_BASED_OUTPATIENT_CLINIC_OR_DEPARTMENT_OTHER): Payer: Self-pay | Admitting: Family Medicine

## 2016-03-07 ENCOUNTER — Ambulatory Visit (HOSPITAL_BASED_OUTPATIENT_CLINIC_OR_DEPARTMENT_OTHER): Payer: No Typology Code available for payment source | Admitting: Family Medicine

## 2016-03-07 VITALS — BP 128/76 | HR 89 | Temp 98.9°F | Wt 144.2 lb

## 2016-03-07 DIAGNOSIS — K5901 Slow transit constipation: Principal | ICD-10-CM

## 2016-03-07 MED ORDER — POLYETHYLENE GLYCOL 3350 17 G PO PACK
17.0000 g | PACK | Freq: Every day | ORAL | 1 refills | Status: DC | PRN
Start: 2016-03-07 — End: 2016-04-11

## 2016-03-07 NOTE — Progress Notes (Signed)
CC: abd pain, constipation  Jonathan [name] is a 45 year old male, has been tx for constipation with miralax, feeling better but not 100%. Still getting intermittent low and RLQ abdominal pain.  1 small BM today, none yesterday  ROS: no fevers/chills    Patient Active Problem List:     Headache(784.0)     Other and unspecified hyperlipidemia     Allergic rhinitis, cause unspecified     Family history of stroke (cerebrovascular)     Sciatica     Esophageal reflux        Current Outpatient Prescriptions on File Prior to Visit:  [DISCONTINUED] polyethylene glycol (GLYCOLAX/MIRALAX) packet Take 1 packet by mouth daily Disp: 10 Package Rfl: 0   capsaicin (ZOSTRIX) 0.025 % cream Apply topically 4 (four) times daily as needed Disp: 60 g Rfl: 2     No current facility-administered medications on file prior to visit.     Review of Patient's Allergies indicates:  No Known Allergies    BP 128/76   Pulse 89   Temp 98.9 F (37.2 C) (Temporal)   Wt 65.4 kg (144 lb 3.2 oz)   SpO2 97%   BMI 23.63 kg/m2    O: NAD  HEENT: atraumatic, no icterus, OP clear  Skin: no lesions or rashes  CTA bilat  nl S1/S2, no murmur  abd soft, NT/ND  Ext: no edema or clubbing      A/P:  1. Slow transit constipation  Refilled miralax, repeat imaging at pt request.   - REFERRAL TO GASTROENTEROLOGY ( INT)  - XR Abdomen (KUB) 1 Vw; Future      We discussed the patients current medications. The patient expressed understanding and no barriers to adherence were identified.

## 2016-03-28 ENCOUNTER — Ambulatory Visit (HOSPITAL_BASED_OUTPATIENT_CLINIC_OR_DEPARTMENT_OTHER): Payer: Self-pay | Admitting: Family Medicine

## 2016-03-28 DIAGNOSIS — K5901 Slow transit constipation: Secondary | ICD-10-CM

## 2016-03-28 LAB — XR ABDOMEN (KUB) WITH UPRIGHT AND OR DECUBITUS

## 2016-03-28 NOTE — Addendum Note (Signed)
Addended by: Estevan RyderJONES, YVETTE on: 03/28/2016 12:20 PM     Modules accepted: Orders

## 2016-04-10 ENCOUNTER — Telehealth (HOSPITAL_BASED_OUTPATIENT_CLINIC_OR_DEPARTMENT_OTHER): Payer: Self-pay | Admitting: Registered Nurse

## 2016-04-10 DIAGNOSIS — K5900 Constipation, unspecified: Secondary | ICD-10-CM

## 2016-04-10 NOTE — Progress Notes (Signed)
Impression:    1. Nonobstructive bowel gas pattern.    2. Prominent amount of stool throughout the colon.

## 2016-04-10 NOTE — Telephone Encounter (Signed)
-----   Message from [facility] sent at 04/10/2016 11:54 AM EDT -----  Regarding: ultrasound result   Contact: [phone]  Jonathan [name] [id number], 45 year old, male    Calls today:  Test Results  Test Results Request from Patient    What test result(s) is the patient requesting? Ultrasound   Date testing was done last week per pt   Who ordered the test(s) Gumpert?  Message routed to RN  Person calling on behalf of patient: Patient (self)    CALL BACK NUMBER: [phone]    Patient's language of care: Tong[place]    Patient needs a [place] interpreter.    Patient's PCP: Lawana Chambers, MD, MD

## 2016-04-11 MED ORDER — POLYETHYLENE GLYCOL 3350 17 G PO PACK
PACK | ORAL | 2 refills | Status: DC
Start: 2016-04-11 — End: 2016-08-15

## 2016-04-11 NOTE — Progress Notes (Signed)
spoke with pt via interpreter. Explained results and plan to continue with Miralax. Pt states he finished Rx and is no longer taking. Explained importance of continuing to take to help with constipation, reviewed to take 1-3 times a day with goal of 1-2 BM's daily. Reminded of appt with GI, patient verbalized agreement with plan

## 2016-04-11 NOTE — Progress Notes (Signed)
    He might want to increase the miralax to titrate to 1-2 large bowel movements per day before seeing GI

## 2016-04-11 NOTE — Telephone Encounter (Signed)
-----   Message from [facility] sent at 04/11/2016  2:57 PM EDT -----  Regarding: resukts returning call   Contact: [phone]  Jonathan [name] [id number], 45 year old, male    Calls today:  Clinical Questions (NON-SICK CLINICAL QUESTIONS ONLY)    Name of person calling Jonathan [name]  Specific nature of request returning call from [facility] for results  Return phone number [phone]  Person calling on behalf of patient: Patient (self)      Patient's language of care: Tonga    Patient needs a Tonga interpreter.    Patient's PCP: Lawana Chambers, MD, MD

## 2016-04-11 NOTE — Progress Notes (Signed)
Message left via interpreter for patient to call back nurse

## 2016-04-28 ENCOUNTER — Ambulatory Visit (HOSPITAL_BASED_OUTPATIENT_CLINIC_OR_DEPARTMENT_OTHER): Payer: No Typology Code available for payment source | Admitting: Gastroenterology

## 2016-05-01 ENCOUNTER — Encounter (HOSPITAL_BASED_OUTPATIENT_CLINIC_OR_DEPARTMENT_OTHER): Payer: Self-pay | Admitting: Internal Medicine

## 2016-05-01 ENCOUNTER — Ambulatory Visit (HOSPITAL_BASED_OUTPATIENT_CLINIC_OR_DEPARTMENT_OTHER): Payer: Self-pay | Admitting: Internal Medicine

## 2016-05-01 VITALS — BP 110/90 | HR 59 | Temp 97.8°F | Wt 148.0 lb

## 2016-05-01 DIAGNOSIS — R519 Headache, unspecified: Secondary | ICD-10-CM

## 2016-05-01 DIAGNOSIS — R51 Headache: Principal | ICD-10-CM

## 2016-05-01 MED ORDER — RIBOFLAVIN 100 MG PO CAPS
100.0000 mg | ORAL_CAPSULE | Freq: Every day | ORAL | 3 refills | Status: DC
Start: 2016-05-01 — End: 2016-12-05

## 2016-05-01 MED ORDER — KETOROLAC TROMETHAMINE 30 MG/ML (FOR FAM USE)
30.00 mg | Freq: Once | INTRAMUSCULAR | Status: AC
Start: 2016-05-01 — End: 2016-05-01
  Administered 2016-05-01: 30 mg via INTRAMUSCULAR

## 2016-05-01 NOTE — Progress Notes (Signed)
Jonathan [name] is a 45 year old male with the following Problems and Medications.    Patient Active Problem List:     Headache disorder     Other and unspecified hyperlipidemia     Allergic rhinitis, cause unspecified     Family history of stroke (cerebrovascular)     Sciatica     Esophageal reflux      Current Outpatient Prescriptions:  Riboflavin 100 MG CAPS Take 100 mg by mouth daily Disp: 90 capsule Rfl: 3   polyethylene glycol (GLYCOLAX/MIRALAX) packet Use 2 to 3 times a day Disp: 100 Package Rfl: 2   capsaicin (ZOSTRIX) 0.025 % cream Apply topically 4 (four) times daily as needed Disp: 60 g Rfl: 2     No current facility-administered medications for this visit.   Review of Patient's Allergies indicates:  No Known Allergies    Subjective:  Jonathan [name] presents in clinic today complaining of Headaches x 2 days  - Has Hx of chronic HA  - Has had full work up by Neuro  - Denies aura, VC, weakness, facial droop, dysarthria  - Has taken riboflavin in the past with mild relief       Review of Systems   Constitutional: Negative.    HENT: Negative for congestion, ear discharge, ear pain, hearing loss, nosebleeds and tinnitus.    Eyes: Positive for pain. Negative for blurred vision, double vision, photophobia, discharge and redness.   Respiratory: Negative.    Cardiovascular: Negative.    Gastrointestinal: Negative.    Genitourinary: Negative.    Musculoskeletal: Negative.    Skin: Negative.    Neurological: Positive for headaches. Negative for dizziness, tingling, tremors, sensory change, speech change, focal weakness, seizures and loss of consciousness.   Endo/Heme/Allergies: Negative.    Psychiatric/Behavioral: Negative.      Physical Exam   Constitutional: He is oriented to person, place, and time.   Neurological: He is alert and oriented to person, place, and time. No cranial nerve deficit. Coordination normal.     Assessment and Plan    (R51) Headache disorder  Comment: Pt given Toradol injection for HA relief. Okay for  him to restart riboflavin if it helped in the past. Recommended drinking more water. Ha possibly 2/2 to dehydration.    Plan: ketorolac (TORADOL) injection 30 mg

## 2016-05-01 NOTE — Progress Notes (Signed)
NKDA  Toradol 30 mg Im administered for headache per Dr. Lenox PondsEnglish order  The patient tolerated well

## 2016-05-10 ENCOUNTER — Ambulatory Visit (HOSPITAL_BASED_OUTPATIENT_CLINIC_OR_DEPARTMENT_OTHER): Payer: Self-pay | Admitting: Gastroenterology

## 2016-05-10 VITALS — BP 101/68 | HR 76 | Temp 98.5°F | Wt 147.0 lb

## 2016-05-10 DIAGNOSIS — K5900 Constipation, unspecified: Secondary | ICD-10-CM

## 2016-05-11 NOTE — Progress Notes (Signed)
Quick Summary/Overview:      Jonathan Chambersichard H. Gumpert, MD, MD  [address]  [place] [address] [facility]  [place], [place] [id number]    Dear Dr. [name], MD, MD,    I wish to thank you for referring your patient.  ___________________________________  REASON FOR CONSULT:   I am asked to see our patient in clinical consultation for evaluation of a chief complaint of Constipation, unspecified constipation type  (primary encounter diagnosis)          CHIEF COMPLAINT   Evaluation of this chief complaint led to the following visit diagnoses:   Constipation, unspecified constipation type  (primary encounter diagnosis)    HPI   The patient is a 45 year old male who I am seeing in clinical consultation for evaluation a chief complaint of chronic constipation.  The patient states that he had experienced chronic constipation for a very prolonged period of time.  His symptoms have only recently improved.  He also has some slight rectal pain in association with his symptoms.  He told me that he was started on miralax by his healthcare provider and states that he is doing well with no constipation on miralax.  He denies any family or personal history of colon cancer.  He denies any rectal bleeding or weight loss.    PAST MEDICAL HISTORY   Patient Active Problem List:     Headache disorder     Other and unspecified hyperlipidemia     Allergic rhinitis, cause unspecified     Family history of stroke (cerebrovascular)     Sciatica     Esophageal reflux        SURGICAL HISTORY  Past Surgical History:  3/05: EXCISION/TRANSPOSITION PTERYGIUM W/O GRAFT      Comment: Dr Jonathan [name]    CURRENT MEDICATIONS    Current Outpatient Prescriptions:  Riboflavin 100 MG CAPS Take 100 mg by mouth daily Disp: 90 capsule Rfl: 3   polyethylene glycol (GLYCOLAX/MIRALAX) packet Use 2 to 3 times a day Disp: 100 Package Rfl: 2   capsaicin (ZOSTRIX) 0.025 % cream Apply topically 4 (four) times daily as needed Disp: 60 g Rfl: 2     No current facility-administered  medications for this visit.     ALLERGIES   Review of Patient's Allergies indicates:  No Known Allergies      FAMILY HISTORY     Family History    Stroke Father     Comment: [id number]    GI Mother     Comment: died [id number] after ?GI surgery    Blood Disease Brother     Comment: not sure what        SOCIAL HISTORY   Smoking status: Never Smoker                                                              Alcohol use: No                    REVIEW OF SYSTEMS PHYSICAL EXAM   Review of Systems:  All systems were otherwise negative except for the GI and other systems documented in the History of the Present Illness.    __________________________________________________  Physical Exam:  The patient is sitting in the examining table and appears to  be in no apparent distress.  BP 101/68   Pulse 76   Temp 98.5 F (36.9 C) (Temporal)   Wt 66.7 kg (147 lb)   SpO2 96%   BMI 24.09 kg/m2  HEENT: Normocephalic atraumatic  Oropharynx: Clear with no erythema or exudates  Neck: Supple no palpable lymphadenopathy in the neck or the axilla  Lungs: Clear to ausculation bilaterally  Heart: Regular rate and rhythm,  no murmurs rubs or gallops  ZOX:WRUE non tender with positive bowel sounds.  Extremities: No swelling or edema  Skin: No rashes or lesions  Neuro: Alert and oriented to person , place and time  _____________________________________________     ASSESSMENT/  PLAN, Test/Procedures ordered and recommended follow-up  :.:.  In summary this is a 45 year old gentleman with a history of chronic constipation and abdominal discomfort.  He is currently doing well on his regimen of miralax.  My recommendation for now is that he continue his miralax and follow-up in the near future in order to undergo further treatment if warranted.  If his symptoms persist or he has any additional worrisome symptoms such as rectal bleeding plan will be for diagnostic colonoscopy.

## 2016-05-31 ENCOUNTER — Ambulatory Visit (HOSPITAL_BASED_OUTPATIENT_CLINIC_OR_DEPARTMENT_OTHER): Payer: MEDICAID | Admitting: Gastroenterology

## 2016-05-31 VITALS — BP 118/77 | HR 84 | Temp 98.3°F | Wt 147.0 lb

## 2016-05-31 DIAGNOSIS — K5900 Constipation, unspecified: Secondary | ICD-10-CM

## 2016-05-31 DIAGNOSIS — R1031 Right lower quadrant pain: Secondary | ICD-10-CM

## 2016-05-31 NOTE — Progress Notes (Signed)
TongaPortuguese interpreter needed for this visit.    Complains of RLQ abdominal pain Denies nausea and vomiting    Safe at home.

## 2016-05-31 NOTE — Progress Notes (Signed)
Quick Summary/Overview:      [name], MD, MD  [address]  [place] [address] [facility]  [place], [place] [id number]    Dear Dr. [name], MD, MD,    I wish to thank you for referring your patient.  ___________________________________  REASON FOR CONSULT:   I am asked to see our patient in clinical consultation for evaluation of a chief complaint of Abdominal pain, right lower quadrant  (primary encounter diagnosis)  Constipation, unspecified constipation type          CHIEF COMPLAINT   Evaluation of this chief complaint led to the following visit diagnoses:   Abdominal pain, right lower quadrant  (primary encounter diagnosis)  Constipation, unspecified constipation type    HPI This is a follow-up visit for this [age] year old male who's had recurrent episodes of constipation and abdominal discomfort.  Patient also was concerned because he stated recently been diagnosed and treated for an infection with schistosomiasis is worried that his abdominal pain symptoms might of been related to that.  He localizes pain primarily to the right lower quadrant of the abdomen.  It does not radiate and primarily is worsened with movement such as twisting and heavy lifting.  Denies any fevers chills or diarrhea.    PAST MEDICAL HISTORY   Patient Active Problem List:     Headache disorder     Other and unspecified hyperlipidemia     Allergic rhinitis, cause unspecified     Family history of stroke (cerebrovascular)     Sciatica     Esophageal reflux        SURGICAL HISTORY  Past Surgical History:  3/05: EXCISION/TRANSPOSITION PTERYGIUM W/O GRAFT      Comment: Dr Nathaneil CanaryPatalano    CURRENT MEDICATIONS    Current Outpatient Prescriptions:  ibuprofen (ADVIL,MOTRIN) 200 MG tablet Take 200 mg by mouth every 6 (six) hours as needed for Pain Disp:  Rfl:    polyethylene glycol (GLYCOLAX/MIRALAX) packet Use 2 to 3 times a day Disp: 100 Package Rfl: 2   Riboflavin 100 MG CAPS Take 100 mg by mouth daily Disp: 90 capsule Rfl: 3   capsaicin  (ZOSTRIX) 0.025 % cream Apply topically 4 (four) times daily as needed Disp: 60 g Rfl: 2     No current facility-administered medications for this visit.     ALLERGIES   Review of Patient's Allergies indicates:  No Known Allergies      FAMILY HISTORY     Family History    Stroke Father     Comment: [id number]    GI Mother     Comment: died 5769 after ?GI surgery    Blood Disease Brother     Comment: not sure what        SOCIAL HISTORY   Smoking status: Never Smoker                                                              Alcohol use: No                    REVIEW OF SYSTEMS PHYSICAL EXAM   Review of Systems:  All systems were otherwise negative except for the GI and other systems documented in the History of the Present Illness.  __________________________________________________  Physical Exam:  The patient is sitting in the examining table and appears to be in no apparent distress.  BP 118/77   Pulse 84   Temp 98.3 F (36.8 C)   Wt 66.7 kg (147 lb)   SpO2 97%   BMI 24.09 kg/m2  HEENT: Normocephalic atraumatic  Oropharynx: Clear with no erythema or exudates  Neck: Supple no palpable lymphadenopathy in the neck or the axilla  Lungs: Clear to ausculation bilaterally  Heart: Regular rate and rhythm,  no murmurs rubs or gallops  Abd: Tenderness to palpation in the right lower quadrant of the abdomen.  No guarding rebound or evidence of peritoneal irritation.  Extremities: No swelling or edema  Skin: No rashes or lesions  Neuro: Alert and oriented to person , place and time  _____________________________________________     ASSESSMENT/  PLAN, Test/Procedures ordered and recommended follow-up  :.:.  In summary this is a 45 year old male with a history of recurrent episodes of abdominal discomfort localizing to the right lower quadrant of the abdomen.  The etiology of her symptoms is unclear but potential clinical concern is that this may be musculoskeletal strain.  Could also be inflammation especially given his history of  infection with schistosomiasis.  Plan is for him to undergo a CT scan of the abdomen this was ordered today.  He will follow-up in our office after completion of the scan and we will review the scan in order to determine whether or not he is had an inflammatory or other focus that may be a source of his clinical symptoms.

## 2016-06-04 ENCOUNTER — Encounter (HOSPITAL_BASED_OUTPATIENT_CLINIC_OR_DEPARTMENT_OTHER): Payer: Self-pay | Admitting: Gastroenterology

## 2016-06-04 LAB — STRONGYLOIDES AB IGG: STRONGYLOIDES AB IgG: 3.3 INDEX (ref 0.00–9.99)

## 2016-06-04 LAB — SCHISTOSOMAL AB IGG: SCHISTOSOMAL AB IgG: 58.69 INDEX — ABNORMAL HIGH (ref 0.00–8.99)

## 2016-06-05 ENCOUNTER — Encounter (HOSPITAL_BASED_OUTPATIENT_CLINIC_OR_DEPARTMENT_OTHER): Payer: Self-pay | Admitting: Gastroenterology

## 2016-06-05 ENCOUNTER — Ambulatory Visit: Payer: Self-pay | Admitting: Gastroenterology

## 2016-06-05 DIAGNOSIS — K5900 Constipation, unspecified: Secondary | ICD-10-CM

## 2016-06-05 DIAGNOSIS — R1031 Right lower quadrant pain: Secondary | ICD-10-CM

## 2016-06-05 LAB — CT ABDOMEN & PELVIS W IV CONTRAST

## 2016-06-05 MED ORDER — BARIUM SULFATE 2.1 % PO SUSP
450.00 mL | ORAL | Status: AC | PRN
Start: 2016-06-05 — End: 2016-06-05
  Administered 2016-06-05 (×2): 450 mL via ORAL

## 2016-06-05 MED ORDER — NORMAL SALINE FLUSH 0.9 % IV SOLN
65.00 mL | Freq: Once | INTRAVENOUS | Status: AC
Start: 2016-06-05 — End: 2016-06-05
  Administered 2016-06-05: 65 mL via INTRAVENOUS

## 2016-06-05 MED ORDER — IOHEXOL 300 MG/ML IJ SOLN
100.00 mL | Freq: Once | INTRAMUSCULAR | Status: AC
Start: 2016-06-05 — End: 2016-06-05
  Administered 2016-06-05: 100 mL via INTRAVENOUS

## 2016-06-05 NOTE — Addendum Note (Signed)
Addended by: Britt BologneseWATERMAN, THERESA on: 06/05/2016 08:54 AM     Modules accepted: Orders

## 2016-06-05 NOTE — Addendum Note (Signed)
Addended by: Sanda LingerPOPA, Haydan Wedig on: 06/05/2016 09:05 AM     Modules accepted: Orders, SmartSet

## 2016-06-06 MED FILL — CLOBETASOL  CRE 0.05%: 14 days supply | Qty: 30 | Fill #0 | Status: CP

## 2016-06-12 ENCOUNTER — Telehealth (HOSPITAL_BASED_OUTPATIENT_CLINIC_OR_DEPARTMENT_OTHER): Payer: Self-pay

## 2016-06-12 NOTE — Progress Notes (Signed)
Re; Fu after CT abdomen         Returned call to Pt. With TongaPortuguese interpreter assisting.      Pt. asking about having colonoscopy and endoscopy.      Asking if there is anything for Pt to do  before the visit.         Pt has appointment on 06/20/16 with Dr.Brown     Pt. informed that Dr. [name] will discuss at FU     Appointment confirmed with pt for 06/20/16

## 2016-06-12 NOTE — Telephone Encounter (Signed)
-----   [facility] from [facility] sent at 06/12/2016 12:12 PM EDT -----  Regarding: Dr. Audrea MuscatBrown  [facility]    Keevan Helaine ChessRoza [id number], 45 year old, male, Telephone Information:  Home Phone      [phone]  Work Phone      Not on file.  Mobile          [phone]      Patient's Preferred Pharmacy:     [facility]  Phone: [phone] Fax: [phone]    [id number] - [place], [place] - [address]  Phone: [phone] Fax: [phone]    [facility]  Phone: [phone] Fax: [phone]      CONFIRMED TODAY: No    CALL BACK NUMBER: [phone]  Best time to call back:   Cell phone:   Other phone:    Available times:    Patient's language of care: TongaPortuguese Sudan[place]ian    Patient needs a TongaPortuguese interpreter.    Patient's PCP: [name], MD, MD    Person calling on behalf of patient: Patient (self)    Calls today with questions and concerns. Patient would like to know if he needs to start treatment before seeing Dr. [name] on October 3rd.

## 2016-06-21 ENCOUNTER — Ambulatory Visit (HOSPITAL_BASED_OUTPATIENT_CLINIC_OR_DEPARTMENT_OTHER): Payer: MEDICAID | Admitting: Gastroenterology

## 2016-07-26 ENCOUNTER — Telehealth (HOSPITAL_BASED_OUTPATIENT_CLINIC_OR_DEPARTMENT_OTHER): Payer: Self-pay | Admitting: Clinic/Center

## 2016-07-26 ENCOUNTER — Ambulatory Visit (HOSPITAL_BASED_OUTPATIENT_CLINIC_OR_DEPARTMENT_OTHER): Payer: Self-pay

## 2016-07-26 NOTE — Telephone Encounter (Signed)
Re Abdominal pain and inflammation    Pt states Dr [name] will understand this problem    Spoke with Pt Pt asking for ABT or something for inflammation

## 2016-07-26 NOTE — Progress Notes (Signed)
Call via TongaPortuguese Telephone interpreter    Returned the patient call   Patient has concerns regarding on-going constipation and abdominal pain symptoms  The patient is followed by GI, [place]   Has a f/u appointment 08/09/16 and requesting sooner appointment  Patient advise to call the GI [phone], to discuss his symptoms and request a sooner appointment

## 2016-07-26 NOTE — Telephone Encounter (Signed)
-----   Message from Namon CirriVenilde Faria Santana sent at 07/26/2016  9:42 AM EST -----  Regarding: patient not feeling well   Patient requested to be called at [phone] complained of stomach pain and constipation  he sated that his appointment is to far out and he needs an antibiotic please review

## 2016-07-26 NOTE — Telephone Encounter (Signed)
Per Dr. [name]    VM left for Pt. today    Pt asked to return call to Dr Theora GianottiBrown's nurse to schedule the appointment    Dr. [name] can see patient/double book on 08/02/16    Dr [name] would like to see Pt before any medications can be ordered.

## 2016-07-26 NOTE — Telephone Encounter (Signed)
Returned call to Pt. Complains of abdominal pain 8/10. Denies N/V or fever.      Patient is experiancing abdominal pain and would like a new medication for it please

## 2016-08-04 ENCOUNTER — Emergency Department (HOSPITAL_BASED_OUTPATIENT_CLINIC_OR_DEPARTMENT_OTHER)
Admission: RE | Admit: 2016-08-04 | Disposition: A | Discharge status: Home / Self Care | Payer: Self-pay | Source: Emergency Department | Attending: Emergency Medicine | Admitting: Emergency Medicine

## 2016-08-04 ENCOUNTER — Encounter (HOSPITAL_BASED_OUTPATIENT_CLINIC_OR_DEPARTMENT_OTHER): Payer: Self-pay

## 2016-08-04 MED ORDER — LIDOCAINE 5 % EX PTCH
1.0000 | MEDICATED_PATCH | CUTANEOUS | Status: DC
Start: 2016-08-04 — End: 2016-08-04
  Administered 2016-08-04: 1 via TOPICAL
  Filled 2016-08-04: qty 1

## 2016-08-04 MED ORDER — LIDOCAINE 5 % EX PTCH: 1 | patch | CUTANEOUS | 0 refills | 0 days | Status: AC

## 2016-08-04 MED ORDER — IBUPROFEN 600 MG PO TABS
600.0000 mg | ORAL_TABLET | Freq: Three times a day (TID) | ORAL | 0 refills | Status: AC | PRN
Start: 2016-08-04 — End: 2016-08-11

## 2016-08-04 MED ORDER — KETOROLAC TROMETHAMINE 60 MG/2ML IM SOLN
60.0000 mg | Freq: Once | INTRAMUSCULAR | Status: AC
Start: 2016-08-04 — End: 2016-08-04
  Administered 2016-08-04: 60 mg via INTRAMUSCULAR
  Filled 2016-08-04: qty 2

## 2016-08-04 MED ORDER — IBUPROFEN 600 MG PO TABS: 600 mg | tablet | Freq: Three times a day (TID) | ORAL | 0 refills | 0 days | Status: AC | PRN

## 2016-08-04 MED ORDER — LIDOCAINE 5 % EX PTCH
1.00 | MEDICATED_PATCH | CUTANEOUS | 0 refills | Status: AC
Start: 2016-08-04 — End: 2016-08-08

## 2016-08-04 NOTE — ED Triage Note (Signed)
Pt c/o abdominal pain onset yesterday  Pt seen in ED in September diagnosed with "diverticulosis" and "schistosomiasis"  Pt states he has an appt with GI next week "but my stomach is bothering me today"  Last took advil at 8am  Denies nausea or vomiting   Took Miralax this AM, BM this AM with some straining

## 2016-08-04 NOTE — ED Provider Notes (Signed)
ED nursing record was reviewed. Prior records as available electronically through the Epic record were reviewed.    Patient was seen along with Dr. Ramond Craver and management was discussed with them.    Patient is Portuguse speaking and interview, exam, and final disposition were conducted via an official hospital interpreter.    HPI:    This 45 year old male patient h/o constipation, chronic RLQ pain with unremarkable CT abd/pelv on 06/05/16, diverticulosis, schistosomiasis, presents to the Emergency Department with chief complaint of ongoing right lower abdominal pain.  Patient reports that his pain has been ongoing, constant and unchanged since since January 2017.  Patient is requesting a medication to help with his pain management at home.  He was finally diagnosed and treated for schistosomiasis in April 2017. He has been closely followed by GI for his ongoing RLQ pain. He recently had blood work in September which showed elevated levels of schistosomiasis AB.  He reports that he has been taking Advil with some relief, last dose was 8 AM.  He denies nausea, vomiting, or diarrhea.  He reports straining with bowel movements and has been taking OTC laxatives.  He denies blood in his stool.  He denies back or flank pain.  He denies radiation of pain.  He denies recent travel.  He denies antibiotic use.  He denies sick contacts.  He denies fever or chills.  He denies cardiovascular respiratory problems.    Denies HA, dizziness,  visual changes, sore throat, stiff neck, chest pain, shortness of breath, cough, urinary symptoms, rash, or lower extremity edema.    ROS: Pertinent positives were reviewed as per the HPI above. All other systems were reviewed and are negative.      Past Medical History/Problem list:  Past Medical History:  No date: Hyperlipidemia  Patient Active Problem List:     Headache disorder     Other and unspecified hyperlipidemia     Allergic rhinitis, cause unspecified     Family history of stroke  (cerebrovascular)     Sciatica     Esophageal reflux        Past Surgical History: Past Surgical History:  3/05: EXCISION/TRANSPOSITION PTERYGIUM W/O GRAFT      Comment: Dr Nathaneil Canary      Medications:   No current facility-administered medications on file prior to encounter.   Current Outpatient Prescriptions on File Prior to Encounter:  ibuprofen (ADVIL,MOTRIN) 200 MG tablet Take 200 mg by mouth every 6 (six) hours as needed for Pain Disp:  Rfl:    Riboflavin 100 MG CAPS Take 100 mg by mouth daily Disp: 90 capsule Rfl: 3   polyethylene glycol (GLYCOLAX/MIRALAX) packet Use 2 to 3 times a day Disp: 100 Package Rfl: 2   capsaicin (ZOSTRIX) 0.025 % cream Apply topically 4 (four) times daily as needed Disp: 60 g Rfl: 2         Social History:   Social History  Social History   Marital status: Single  Spouse name: N/A    Years of education: N/A  Number of children: N/A     Occupational History  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; 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, family     Social History Main Topics   Smoking status: Never Smoker    Smokeless tobacco: Not on file    Alcohol use No    Drug use: No    Sexual activity: No    Comment: not sexually active     Other Topics Concern  None on file     Social History Narrative    Working: Investment banker, operationalchef - cold station at [employer]        Single, no children        No smoking    ETOH - once a month    No drugs        Lives with brother and sister-in-law and 2 children               Allergies:  Review of Patient's Allergies indicates:  No Known Allergies      Physical Exam:  BP 129/78  Pulse 63  Temp 98.8 F  Resp 17  SpO2 97%    GENERAL: Well appearing, No acute distress, non-toxic.   SKIN:  Warm & Dry, no erythema or rash.  HEAD:  NCAT. Sclerae are anicteric and aninjected, oropharynx is clear with moist mucous membranes. Tonsils symmetrical without erythema, edema, or exudates.  PERRL. EOMI.   NECK: Supple with full range of motion. No meningismus.  No LAN.  LUNGS:  Clear to auscultation bilaterally. No  wheezes, rales, rhonchi.   HEART:  RRR.  No murmurs, rubs, or gallops.   ABDOMEN:  Soft, nondistended, with mild lateral right abdominal tenderness. no hepatosplenomegaly.  No masses or pulsations.  No involuntary guarding or rebound. No tenderness at McBurney's point. Negative Murphy's Sign. Negative Rosving's sign. No other peritoneal signs. Bowel Sounds present x4 quadrants.  EXTREMITIES:  No obvious deformities.  CSM intact x 4.   GENITOURINARY:  No CVA tenderness.  Rectal exam performed with Marchelle FolksAmanda RN as chaperone.  No masses or tenderness.  Guaiac negative.  NEUROLOGIC:  Alert and oriented x4; moves all extremities well; speaking in clear fluent sentences.   PSYCHIATRIC:  Appropriate for age, time of day, and situation      RESULTS  No results found for this visit on 08/04/16 (from the past 24 hour(s)).     RADIOLOGY  N/A    EKG:  N/A    PROCEDURES  N/A    MEDICATIONS ADMINISTERED ON THIS VISIT    Medication Orders Placed This Encounter      ketorolac (TORADOL) injection 60 mg      ibuprofen (ADVIL,MOTRIN) 600 MG tablet          Sig: Take 1 tablet by mouth every 8 (eight) hours as needed pain          Dispense:  20 tablet          Refill:  0      lidocaine (LIDODERM) 5 % patch          Sig: Place 1 patch onto the skin daily          Dispense:  4 patch          Refill:  0       ED Course and Medical Decision-making:  This 45 year old male patient h/o constipation, chronic RLQ pain with unremarkable CT abd/pelv on 06/05/16, diverticulosis, schistosomiasis, presents to the Emergency Department with chief complaint of ongoing right lower abdominal pain.     Vital Signs stable, within normal limits, afebrile.  Patient is very well-appearing, nontoxic, and in no acute distress.  Patient has mild tenderness to the right lateral aspect of the abdomen without guarding, rebound tenderness, or peritoneal signs.  No tenderness over McBurney's point.    Given the location of the patient's symptoms, evaluation suggestive of  musculoskeletal etiology, though given the constellation of the patient's ongoing symptoms  seemingly related to schistosomiasis with recent elevated antibodies, will consult GI to discuss need for antibiotic re-treatment.  I spoke with both Juanita belton PA and Dr. [name] who both do not recommend retreating schistosomiasis as denies remain elevated for very long time.  They too have discussed likely musculoskeletal etiology and recommend NSAIDs, heat, and rest.  I do not suspect acute intra-abdominal pathology such as acute appendicitis, cholecystitis, bowel obstruction, or diverticulitis.  I do feel patient's symptoms may also be related to constipation as he has had increased straining BMs.  Recommended that he continue to be more consistent with his MiraLAX use until he has a regular bowel movement and add a stool softener.    Patient was treated with Toradol and a lidocaine patch and given a prescription for ibuprofen and lidocaine patches at discharge for home relief.  Recommended heat therapy.  Close follow-up advised.    Patient was instructed to follow-up with Dr. [name] at his appointment on 08/09/2016 for re-evaluation.  Signs and symptoms for which to return to the Emergency Department were discussed with the patient in detail, and they understand and are in agreement with her care plan at this time.    The patient remained hemodynamically stable throughout entire visit.    Condition: Stable, improved  Disposition: Home      Diagnosis/Diagnoses:  Right lateral abdominal pain    Bryna ColanderKrista Helen Winterhalter, PA-C  Emergency Department  [facility]    This Emergency Department patient encounter note was created using voice-recognition software and in real time during the ED visit. Please excuse any typographical errors that have not been edited out.

## 2016-08-04 NOTE — Narrator Note (Signed)
Patient Disposition    Patient education for diagnosis, medications, activity, diet and follow-up.  Patient left ED 3:10 PM.  Patient rep received written instructions.  Interpreter to provide instructions: Yes    Patient belongings with patient: YES    Have all existing LDAs been addressed? N/A    Have all IV infusions been stopped? N/A    Discharged to: Discharged to home  Pt reports some relief of pain, states he feels ready to be discharged  Educated on how to take prescribed medications for pain at home, pt to follow up next Wednesday with GI

## 2016-08-04 NOTE — Narrator Note (Signed)
PA and RN at bedside for rectal exam. Pt guaiac negative.

## 2016-08-04 NOTE — Discharge Instructions (Signed)
Your seen in the emergency department for right lateral abdominal pain likely related to muscular strain.  Please use heat, ice, and ibuprofen for pain management.  You may also alternate Tylenol and ibuprofen.  Here is here as an example of how you can alternate this medication.     - Alternate doses of Tylenol (acetaminophen) with Motrin/Advil (ibuprofen) to reduce pain.  Wait 6 hours between the SAME kind of medication.  An example schedule follows:       6:00am   Motrin/Advil (ibuprofen)  600 MG as prescribed, TAKE WITH FOOD!     9:00am   Tylenol (acetaminophen)  975 MG = 3 REGULAR strength tablets     12:00noon    Motrin/Advil     3:00pm    Tylenol     6:00pm    Motrin/Advil     9:00pm    Tylenol    12:4800midnight   Motrin/Advil  (NEVER MORE THAN THREE (3) DOSES TYLENOL in one day as too much Tylenol will poison your liver!!!)    I spoke with Dr. [name] from gastroenterology who does not recommend retreating you for the schistosomiasis.  He would like to see you at your next appointment on 08/09/2016.  Return to the emergency department with any worsening abdominal pain, bloody stools, or any other concerning symptoms.

## 2016-08-04 NOTE — Narrator Note (Signed)
PA at bedside.

## 2016-08-09 ENCOUNTER — Ambulatory Visit (HOSPITAL_BASED_OUTPATIENT_CLINIC_OR_DEPARTMENT_OTHER): Payer: No Typology Code available for payment source | Admitting: Gastroenterology

## 2016-08-09 VITALS — BP 118/70 | HR 70 | Wt 147.0 lb

## 2016-08-09 DIAGNOSIS — K5900 Constipation, unspecified: Secondary | ICD-10-CM

## 2016-08-09 DIAGNOSIS — R1031 Right lower quadrant pain: Principal | ICD-10-CM

## 2016-08-09 MED ORDER — PEG 3350-KCL-NABCB-NACL-NASULF 227.1 G PO SOLR: 4000 mL | Bottle | 0 refills | 0 days | Status: AC

## 2016-08-09 MED ORDER — PEG 3350-KCL-NABCB-NACL-NASULF 227.1 G PO SOLR
4000.00 mL | ORAL | 0 refills | Status: AC
Start: 2016-08-09 — End: 2016-11-07

## 2016-08-09 NOTE — Progress Notes (Signed)
Quick Summary/Overview:      [name], MD, MD  [address]  [place] [address] [facility]  [place], [place] [id number]    Dear Dr. [name], MD, MD,    I wish to thank you for referring your patient.  ___________________________________  REASON FOR CONSULT:   I am asked to see our patient in clinical consultation for evaluation of a chief complaint of Right lower quadrant pain  (primary encounter diagnosis)  Constipation, unspecified constipation type          CHIEF COMPLAINT   Evaluation of this chief complaint led to the following visit diagnoses:   Right lower quadrant pain  (primary encounter diagnosis)  Constipation, unspecified constipation type    HPI this is a follow-up visit for this 45 year old male who has a known history of chronic right lower quadrant abdominal pain.  He is undergone extensive evaluation to date and most recently I performed a CT scan of the abdomen.  The CT scan did not show an explanation for her symptoms.  Did show some diverticulosis but this was primarily on the left side.  After reviewing the patient's clinical symptoms which still persist at this point my recommendation is that he undergo a diagnostic colonoscopy.  Informed consent was obtained and the risks and benefits of the procedure were explained to the patient I also recommended a continue on a new fiber supplement Metamucil powder.  He had been taking the pills but my clinical concern is that he may not drinking enough water.    PAST MEDICAL HISTORY   Patient Active Problem List:     Headache disorder     Other and unspecified hyperlipidemia     Allergic rhinitis, cause unspecified     Family history of stroke (cerebrovascular)     Sciatica     Esophageal reflux        SURGICAL HISTORY  Past Surgical History:  3/05: EXCISION/TRANSPOSITION PTERYGIUM W/O GRAFT      Comment: Dr Nathaneil CanaryPatalano    CURRENT MEDICATIONS    Current Outpatient Prescriptions:  PEG 3350-KCl-NaBcb-NaCl-NaSulf 227.1 g SOLR Take 4,000 mLs by mouth  See Admin Instructions Disp: 1 Bottle Rfl: 0   ibuprofen (ADVIL,MOTRIN) 600 MG tablet Take 1 tablet by mouth every 8 (eight) hours as needed pain Disp: 20 tablet Rfl: 0   Riboflavin 100 MG CAPS Take 100 mg by mouth daily Disp: 90 capsule Rfl: 3   polyethylene glycol (GLYCOLAX/MIRALAX) packet Use 2 to 3 times a day Disp: 100 Package Rfl: 2   capsaicin (ZOSTRIX) 0.025 % cream Apply topically 4 (four) times daily as needed Disp: 60 g Rfl: 2     No current facility-administered medications for this visit.     ALLERGIES   Review of Patient's Allergies indicates:  No Known Allergies      FAMILY HISTORY     Family History    Stroke Father     Comment: [id number]    GI Mother     Comment: died [id number] after ?GI surgery    Blood Disease Brother     Comment: not sure what        SOCIAL HISTORY   Smoking status: Never Smoker  Alcohol use: No                    REVIEW OF SYSTEMS PHYSICAL EXAM   Review of Systems:  All systems were otherwise negative except for the GI and other systems documented in the History of the Present Illness.    __________________________________________________  Physical Exam:  The patient is sitting in the examining table and appears to be in no apparent distress.  BP 118/70  Pulse 70  Wt 66.7 kg (147 lb)  SpO2 96%  BMI 24.09 kg/m2  HEENT: Normocephalic atraumatic  Oropharynx: Clear with no erythema or exudates  Neck: Supple no palpable lymphadenopathy in the neck or the axilla  Lungs: Clear to ausculation bilaterally  Heart: Regular rate and rhythm,  no murmurs rubs or gallops  Abd: Tenderness to deep palpation in the right lower quadrant of the abdomen.  Partially relieved by applying a NSAID patch directed to the area.  In summary this is a 45 year old gentleman with a history  Extremities: No swelling or edema  Skin: No rashes or lesions  Neuro: Alert and oriented to person , place and time  _____________________________________________      ASSESSMENT/  PLAN, Test/Procedures ordered and recommended follow-up  :.:.  Right lower quadrant abdominal pain.  Etiology is unclear but given the persistence of his presentation I believe colonoscopy is warranted at this point.  Be looking for inflammatory lesions obstructing lesions or any other lesion that could explain his chronic symptoms.  Informed consent was obtained for the colonoscopy and the risks and benefits of the procedure were reviewed with the patient.  He will follow-up at the time of the procedure.

## 2016-08-09 NOTE — Progress Notes (Signed)
[place]ong[place] interpreter needed for this visit.    Complains of right sided mid abdominal pain 7/10 At times can radiate to left back     Denies nausea and vomiting.    Safe at home

## 2016-08-15 ENCOUNTER — Other Ambulatory Visit (HOSPITAL_BASED_OUTPATIENT_CLINIC_OR_DEPARTMENT_OTHER): Payer: Self-pay

## 2016-08-15 ENCOUNTER — Ambulatory Visit (HOSPITAL_BASED_OUTPATIENT_CLINIC_OR_DEPARTMENT_OTHER): Payer: No Typology Code available for payment source | Admitting: Family Medicine

## 2016-08-15 ENCOUNTER — Encounter (HOSPITAL_BASED_OUTPATIENT_CLINIC_OR_DEPARTMENT_OTHER): Payer: Self-pay | Admitting: Family Medicine

## 2016-08-15 VITALS — BP 121/75 | HR 76 | Temp 98.0°F | Wt 145.4 lb

## 2016-08-15 DIAGNOSIS — K5641 Fecal impaction: Secondary | ICD-10-CM

## 2016-08-15 DIAGNOSIS — Z0000 Encounter for general adult medical examination without abnormal findings: Secondary | ICD-10-CM

## 2016-08-15 MED ORDER — POLYETHYLENE GLYCOL 3350 PO POWD: g | ORAL | 2 refills | 0 days | Status: DC

## 2016-08-15 MED ORDER — KETOROLAC TROMETHAMINE 30 MG/ML (FOR FAM USE)
30.00 mg | Freq: Once | INTRAMUSCULAR | Status: AC
Start: 2016-08-15 — End: 2016-08-15
  Administered 2016-08-15: 30 mg via INTRAMUSCULAR

## 2016-08-15 MED ORDER — POLYETHYLENE GLYCOL 3350 17 GM/SCOOP PO POWD
ORAL | 2 refills | Status: DC
Start: 2016-08-15 — End: 2016-09-27

## 2016-08-15 NOTE — Progress Notes (Signed)
CC: abd pain  Jonathan [name] is a 45 year old male c/o Persistent RLQ pain  Saw GI last week, did not feel it was helpful  Thinks his schistosomiasis came back based on a repeat + serology  Not sleeping well, not eating well, headache  Hurts when he touches his testicles his belly hurts, testicles are not swollen    Takes fiber supplement but not working  miralax worked before but feels like he got used to it  Lidocaine patch was initially helpful, toradol was helpful    No BM in past 2 days    ROS: no fevers/chills.    Patient Active Problem List:     Headache disorder     Other and unspecified hyperlipidemia     Allergic rhinitis, cause unspecified     Family history of stroke (cerebrovascular)     Sciatica     Esophageal reflux        Current Outpatient Prescriptions on File Prior to Visit:  PEG 3350-KCl-NaBcb-NaCl-NaSulf 227.1 g SOLR Take 4,000 mLs by mouth See Admin Instructions Disp: 1 Bottle Rfl: 0   Riboflavin 100 MG CAPS Take 100 mg by mouth daily Disp: 90 capsule Rfl: 3   capsaicin (ZOSTRIX) 0.025 % cream Apply topically 4 (four) times daily as needed Disp: 60 g Rfl: 2     No current facility-administered medications on file prior to visit.     Review of Patient's Allergies indicates:  No Known Allergies    BP 121/75 (Site: LA, Position: Sitting, Cuff Size: Reg)  Pulse 76  Temp 98 F (36.7 C) (Temporal)  Wt 66 kg (145 lb 6.4 oz)  SpO2 97%  BMI 23.83 kg/m2    O: NAD  HEENT: atraumatic, no icterus, OP clear  Skin: no lesions or rashes  Breathing comfortably  Ext: no edema or clubbing      A/P:  1. Fecal impaction (HCC)  Reviewed all previous imaging of abdomen, there seems to be a persistently high stool burden even on CT that was read as normal. Discussed with patient that he needs to be consistent about taking laxatives, fiber, increased hydration.  I am concerned that the patient does not buy into this diagnosis and he believes that he has an infection or inflammatory condition, when all  available information suggests that this is mechanical.      We discussed the patients current medications. The patient expressed understanding and no barriers to adherence were identified.    I have spent 25 minutes in face to face time with this patient/patient proxy of which > 50% was in counseling or coordination of care regarding above issues/Dx.

## 2016-08-15 NOTE — Progress Notes (Signed)
Toradol 30mg given on Lt deltoid per doctor's ordered. Patient tolerated well.

## 2016-08-29 ENCOUNTER — Ambulatory Visit (HOSPITAL_BASED_OUTPATIENT_CLINIC_OR_DEPARTMENT_OTHER)
Admit: 2016-08-29 | Disposition: A | Discharge status: Home / Self Care | Payer: Self-pay | Source: Ambulatory Visit | Attending: Gastroenterology | Admitting: Gastroenterology

## 2016-08-29 ENCOUNTER — Encounter (HOSPITAL_BASED_OUTPATIENT_CLINIC_OR_DEPARTMENT_OTHER): Payer: Self-pay | Admitting: Gastroenterology

## 2016-08-29 MED ORDER — FENTANYL CITRATE 0.05 MG/ML IJ SOLN
Freq: Once | INTRAMUSCULAR | Status: AC | PRN
Start: 2016-08-29 — End: 2016-08-29
  Administered 2016-08-29: 25 ug via INTRAVENOUS

## 2016-08-29 MED ORDER — FENTANYL CITRATE 0.05 MG/ML IJ SOLN
INTRAMUSCULAR | Status: DC
Start: 2016-08-29 — End: 2016-08-30
  Filled 2016-08-29: qty 2

## 2016-08-29 MED ORDER — MIDAZOLAM HCL 2 MG/2ML IJ SOLN
Freq: Once | INTRAMUSCULAR | Status: AC | PRN
Start: 2016-08-29 — End: 2016-08-29
  Administered 2016-08-29: 1 mg via INTRAVENOUS

## 2016-08-29 MED ORDER — ONDANSETRON HCL 4 MG/2ML IJ SOLN
4.0000 mg | INTRAMUSCULAR | Status: DC | PRN
Start: 2016-08-29 — End: 2016-08-30

## 2016-08-29 MED ORDER — FENTANYL CITRATE 0.05 MG/ML IJ SOLN
Freq: Once | INTRAMUSCULAR | Status: AC | PRN
Start: 2016-08-29 — End: 2016-08-29
  Administered 2016-08-29: 50 ug via INTRAVENOUS

## 2016-08-29 MED ORDER — MIDAZOLAM HCL 2 MG/2ML IJ SOLN
INTRAMUSCULAR | Status: DC
Start: 2016-08-29 — End: 2016-08-30
  Filled 2016-08-29: qty 2

## 2016-08-29 MED ORDER — MIDAZOLAM HCL 2 MG/2ML IJ SOLN
INTRAMUSCULAR | Status: DC
Start: 2016-08-29 — End: 2016-08-30
  Filled 2016-08-29: qty 4

## 2016-08-29 MED ORDER — MIDAZOLAM HCL 2 MG/2ML IJ SOLN
Freq: Once | INTRAMUSCULAR | Status: AC | PRN
Start: 2016-08-29 — End: 2016-08-29
  Administered 2016-08-29: 2 mg via INTRAVENOUS

## 2016-08-29 MED ORDER — LACTATED RINGERS IV SOLN
INTRAVENOUS | Status: DC
Start: 2016-08-29 — End: 2016-08-30
  Administered 2016-08-29: 13:00:00 via INTRAVENOUS

## 2016-08-29 NOTE — PROVATION-GI (Signed)
[facility]  Patient Name: Jonathan Mangledemir Risden  MRN: [id number]  Account Number: [id number]  Gender: Male  Age: [age]  Date of Birth: 01/08/1971  Admit Type: Outpatient  [facility]  Note Status: Finalized  Referring MD:         [name], MD  Procedure Date:       08/29/2016 1:31:30 PM  Procedure:            Colonoscopy  Endoscopist:          Joretta BachelorALPHONSO BROWN JR, MD  Indications for Procedure:       Screening for colorectal malignant neoplasm  Medications:          Fentanyl 200 micrograms IV, Midazolam 6 mg IV  Procedure:       Just prior to the procedure, an updated history and physical was done. I        obtained an informed consent from the patient reviewing the risk of the        procedure including (but not limited to) respiratory depression, perforation,        bleeding, discomfort, a possible need for surgery and unexpected reactions to        medications. The patient is aware that test has limitations and may not        detect significant lesions such as cancer or other potential diseases. The        patient was also informed that they might need a repeat colonoscopy earlier        than standard guidelines if there are changes in their symptoms or concerning        findings noted. A time out was performed with the entire procedure staff        present. The scope was passed under direct vision. Throughout the procedure,        the patient's blood pressure, pulse, and oxygen saturations were monitored        continuously. The [id number] was introduced through the anus and        advanced to the cecum, identified by appendiceal orifice and ileocecal valve.        The scope was then slowly withdrawn with confirmation of the noted findings.        The colonoscopy was performed without difficulty. The patient tolerated the        procedure well. Scope withdrawal time was 7 minutes.  Findings:       Non-bleeding internal hemorrhoids were found during retroflexion. The        hemorrhoids were  moderate.  Post Procedure Diagnosis:       - Non-bleeding internal hemorrhoids.       - No specimens collected.  Complications:        No immediate complications.  Recommendation:       - Repeat colonoscopy in 10 years for surveillance.  Joretta BachelorALPHONSO BROWN, JR., MD  [facility] Gwyneth SproutBROWN JR, MD  08/29/2016 2:30:55 PM  This report has been signed electronically.  Number of Addenda: 0  Note Initiated On: 08/29/2016 1:31 PM

## 2016-08-29 NOTE — Narrator Note (Signed)
Moderate Sedation    Patient Care Timeline     Sedation: 08/29/2016 13:54 to 14:22  Sign Off    Time Event Details User    13:54:09 Sedation Start    Jori MollMatthew Q. Andrey CampanileWilson, RN    [phone]:59 Staff Arrived Joretta BachelorBrown, Alphonso Jr. [Physician]; Harrison MonsWilson, Kazuo Durnil Q., RN [Registered Nurse]; Beaupre, Belkis [Scrub Tech]; Unknown Resident 1 [Resident]   Jori MollMatthew Q. Morayo Leven, RN    13:56:00 Devices Testing Template Device Data    Pulse:  81 (Device Time: 13:56:01) Resp:  22 (Device Time: 13:56:01)   SpO2:  97 % (Device Time: 13:56:01) BP:  108/74 (Device Time: 13:56:01)   MAP (mmHg):  85.33    Additional Vitals (Critical Care Only)    AWRR:  24 (Device Time: 13:56:01)    Other flowsheet entries    ETCO2 (mmHg):  28 mmHg (Device Time: 13:56:01)     Jori MollMatthew Q. Andrey CampanileWilson, RN    13:56:06 [place] Mod Sedation [employer] [employer] Scale Details    [place] [employer] Behavioral Pain Scale (for sedated patients):  0 - Restful.  No facial expression Sedation Scale (Modified Ramsey):  5 - Alert   Motor Strength ( for sedation scale):  3 - Strong, complete movement Nursing interventions :  Nursing intervention not needed at this time    Jori MollMatthew Q. Khole Arterburn, RN    13:57:00 Devices Testing Template Device Data    Pulse:  81 (Device Time: 13:57:01) Resp:  18 (Device Time: 13:57:01)   SpO2:  97 % (Device Time: 13:57:01) BP:  106/67 (Device Time: 13:57:01)   MAP (mmHg):  80    Additional Vitals (Critical Care Only)    AWRR:  22 (Device Time: 13:57:01)    Other flowsheet entries    ETCO2 (mmHg):  29 mmHg (Device Time: 13:57:01)     Jori MollMatthew Q. Andrey CampanileWilson, RN    13:57:03 [place] Mod Sedation [employer] Behavior[employer] Scale Details    [place] [employer] Behavioral Pain Scale (for sedated patients):  0 - Restful.  No facial expression Sedation Scale (Modified Ramsey):  5 - Alert   Motor Strength ( for sedation scale):  3 - Strong, complete movement Nursing interventions :  Medication increased    Jori MollMatthew Q. Nakyah Erdmann, RN    13:57:47 Medication Ordered and Given midazolam (VERSED)  injection -  Dose:  2 mg ; Route:  Intravenous ; Line:  Peripheral IV 08/29/16 Right Hand Ordered by: Alphonso Gwyneth SproutBrown Jr.   Jori MollMatthew Q. Deverick Pruss, RN    13:57:59 Medication Ordered and Given fentaNYL (SUBLIMAZE) injection -  Dose:  50 mcg ; Route:  Intravenous ; Line:  Peripheral IV 08/29/16 Right Hand Ordered by: Alphonso Gwyneth SproutBrown Jr.   Jori MollMatthew Q. Andrey CampanileWilson, RN    14:00:04 [place] Mod Sedation [employer] Behavior[employer] Scale Details    [place] [employer] Behavioral Pain Scale (for sedated patients):  0 - Restful.  No facial expression Sedation Scale (Modified Ramsey):  5 - Alert   Motor Strength ( for sedation scale):  3 - Strong, complete movement Nursing interventions :  Medication increased    Jori MollMatthew Q. Jahi , RN    14:00:41 Medication Ordered and Given midazolam (VERSED) injection -  Dose:  1 mg ; Route:  Intravenous ; Line:  Peripheral IV 08/29/16 Right Hand Ordered by: Alphonso Gwyneth SproutBrown Jr.   Jori MollMatthew Q. Deaglan Lile, RN    14:00:54 Medication Ordered and Given fentaNYL (SUBLIMAZE) injection -  Dose:  25 mcg ; Route:  Intravenous ; Line:  Peripheral IV 08/29/16 Right Hand Ordered by: Alphonso Gwyneth SproutBrown Jr.   Jori MollMatthew Q.  Emilygrace Grothe, RN    14:01:00 Devices Testing Template Device Data    Pulse:  71 (Device Time: 14:01:01) Resp:  18 (Device Time: 14:01:01)   SpO2:  97 % (Device Time: 14:01:01) BP:  102/58 (Device Time: 14:01:01)   MAP (mmHg):  72.67    Additional Vitals (Critical Care Only)    AWRR:  17 (Device Time: 14:01:01)    Other flowsheet entries    ETCO2 (mmHg):  17 mmHg (Device Time: 14:01:01)     Jori Moll. Anandi Abramo, RN    14:03:00 Devices Testing Template Device Data    Pulse:  73 (Device Time: 14:03:01) Resp:  12 (Device Time: 14:03:01)   SpO2:  97 % (Device Time: 14:03:01) BP:  101/58 (Device Time: 14:03:31)   MAP (mmHg):  72.33    Additional Vitals (Critical Care Only)    AWRR:  19 (Device Time: 14:03:01)    Other flowsheet entries    ETCO2 (mmHg):  24 mmHg (Device Time: 14:03:01)     Jori Moll. Carly Sabo, RN    14:03:03 [place]  Mod Sedation [employer] Scale Details    [employer] Pain Scale (for sedated patients):  1 - Moaning, frowning, restless Sedation Scale (Modified Ramsey):  4 - Drowsy   Motor Strength ( for sedation scale):  2 - Moderate movement & strength Nursing interventions :  Medication increased    Jori Moll. Zaedyn Covin, RN    14:03:40 Medication Ordered and Given midazolam (VERSED) injection -  Dose:  1 mg ; Route:  Intravenous ; Line:  Peripheral IV 08/29/16 Right Hand Ordered by: Alphonso Gwyneth Sprout.   Jori Moll Astou Lada, RN    14:03:51 Medication Ordered and Given fentaNYL (SUBLIMAZE) injection -  Dose:  25 mcg ; Route:  Intravenous ; Line:  Peripheral IV 08/29/16 Right Hand Ordered by: Alphonso Gwyneth Sprout.   Jori Moll Denine Brotz, RN    14:06:00 Devices Testing Template Device Data    Pulse:  88 (Device Time: 14:06:01) Resp:  26 (Device Time: 14:06:01)   SpO2:  99 % (Device Time: 14:06:01) BP:  110/63 (Device Time: 14:06:01)   MAP (mmHg):  78.67    Additional Vitals (Critical Care Only)    AWRR:  21 (Device Time: 14:06:01)    Other flowsheet entries    ETCO2 (mmHg):  33 mmHg (Device Time: 14:06:01)     Jori Moll. Hamid Brookens, RN    14:06:10 [place] Mod Sedation [employer] Scale Details    [employer] Pain Scale (for sedated patients):  2 - Facial grimacing, protective body positioning Sedation Scale (Modified Ramsey):  3 - Awakens easily   Motor Strength ( for sedation scale):  1 - Minimum movement & strength Nursing interventions :  Medication increased    Jori Moll. Law Corsino, RN    14:06:43 Medication Ordered and Given midazolam (VERSED) injection -  Dose:  1 mg ; Route:  Intravenous ; Line:  Peripheral IV 08/29/16 Right Hand Ordered by: Alphonso Gwyneth Sprout.   Jori Moll Elion Hocker, RN    14:06:55 Medication Ordered and Given fentaNYL (SUBLIMAZE) injection -  Dose:  25 mcg ; Route:  Intravenous ; Line:  Peripheral IV 08/29/16 Right Hand Ordered by: Alphonso Gwyneth Sprout.   Jori Moll Tyreik Delahoussaye, RN    14:09:00  Devices Testing Template Device Data    Pulse:  78 (Device Time: 14:09:01) Resp:  26 (Device Time: 14:09:01)   SpO2:  100 % (Device Time: 14:09:01)    Additional Vitals (Critical Care Only)    AWRR:  15 (Device Time: 14:09:01)    Other flowsheet entries    ETCO2 (mmHg):  25 mmHg (Device Time: 14:09:01)     Jori MollMatthew Q. Hjalmar Ballengee, RN    14:09:14 [place] Mod Sedation [employer] Behavior[employer] Scale Details    [place] Behavioral Pain Scale (for sedated patients):  1 - Moaning, frowning, restless Sedation Scale (Modified Ramsey):  3 - Awakens easily   Motor Strength ( for sedation scale):  1 - Minimum movement & strength Nursing interventions :  Medication increased    Jori MollMatthew Q. Jari Dipasquale, RN    14:09:40 Medication Ordered and Given midazolam (VERSED) injection -  Dose:  1 mg ; Route:  Intravenous ; Line:  Peripheral IV 08/29/16 Right Hand Ordered by: Alphonso Gwyneth SproutBrown Jr.   Jori MollMatthew Q. Omid Deardorff, RN    14:09:51 Medication Ordered and Given fentaNYL (SUBLIMAZE) injection -  Dose:  25 mcg ; Route:  Intravenous ; Line:  Peripheral IV 08/29/16 Right Hand Ordered by: Alphonso Gwyneth SproutBrown Jr.   Jori MollMatthew Q. Yara Tomkinson, RN    14:11:00 Devices Testing Template Device Data    Pulse:  70 (Device Time: 14:11:01) Resp:  20 (Device Time: 14:11:01)   SpO2:  94 % (Device Time: 14:11:01) BP:  100/67 (Device Time: 14:11:01)   MAP (mmHg):  78    Additional Vitals (Critical Care Only)    AWRR:  15 (Device Time: 14:11:01)    Other flowsheet entries    ETCO2 (mmHg):  18 mmHg (Device Time: 14:11:01)     Jori MollMatthew Q. Jadon Ressler, RN    14:12:03 [place] Mod Sedation [employer] Behavior[employer] Scale Details    [place] [employer] Behavioral Pain Scale (for sedated patients):  1 - Moaning, frowning, restless Sedation Scale (Modified Ramsey):  3 - Awakens easily   Motor Strength ( for sedation scale):  1 - Minimum movement & strength Nursing interventions :  Medication increased    Jori MollMatthew Q. Juris Gosnell, RN    14:12:42 Medication Ordered and Given fentaNYL (SUBLIMAZE) injection -  Dose:   25 mcg ; Route:  Intravenous ; Line:  Peripheral IV 08/29/16 Right Hand Ordered by: Alphonso Gwyneth SproutBrown Jr.   Jori MollMatthew Q. Juliana Boling, RN    14:15:03 [place] Mod Sedation [employer] Behavior[employer] Scale Details    [place] [employer] Behavioral Pain Scale (for sedated patients):  1 - Moaning, frowning, restless Sedation Scale (Modified Ramsey):  3 - Awakens easily   Motor Strength ( for sedation scale):  1 - Minimum movement & strength Nursing interventions :  Medication increased    Jori MollMatthew Q. Aahil Fredin, RN    14:15:46 Medication Ordered and Given fentaNYL (SUBLIMAZE) injection -  Dose:  25 mcg ; Route:  Intravenous ; Line:  Peripheral IV 08/29/16 Right Hand Ordered by: Alphonso Gwyneth SproutBrown Jr.   Jori MollMatthew Q. Gilberto Streck, RN    14:16:00 Devices Testing Template Device Data    Pulse:  70 (Device Time: 14:16:01) Resp:  13 (Device Time: 14:16:01)   SpO2:  98 % (Device Time: 14:16:01) BP:  112/66 (Device Time: 14:16:01)   MAP (mmHg):  81.33    Additional Vitals (Critical Care Only)    AWRR:  13 (Device Time: 14:16:01)    Other flowsheet entries    ETCO2 (mmHg):  21 mmHg (Device Time: 14:16:01)     Jori MollMatthew Q. Gilmore List, RN    14:21:00 Devices Testing Template Device Data    Pulse:  68 (Device Time: 14:21:01) Resp:  21 (Device Time: 14:21:01)   SpO2:  100 % (Device Time: 14:21:01) BP:  98/60 (Device Time: 14:21:01)   MAP (mmHg):  72.67    Additional Vitals (Critical Care Only)    AWRR:  13 (Device Time: 14:21:01)    Other flowsheet entries    ETCO2 (mmHg):  33 mmHg (Device Time: 14:21:01)     Jori Moll. Andrey Campanile, RN    14:22:14 [place] Mod Sedation [employer] Scale Details    [employer] Pain Scale (for sedated patients):  0 - Restful.  No facial expression Sedation Scale (Modified Ramsey):  3 - Awakens easily   Motor Strength ( for sedation scale):  1 - Minimum movement & strength Nursing interventions :  Nursing intervention not needed at this time    Jori Moll. Andrey Campanile, RN    16:10:96 Staff Departed Joretta Bachelor. [Physician]  (Automatically marked out by Sedation End event); Harrison Mons., RN [Registered Nurse] (Automatically marked out by Sedation End event); Beaupre, Belkis [Scrub Tech] (Automatically marked out by Sedation End event); Unknown Resident 1 [Resident] (Automatically marked out by Sedation End event)   Jori Moll. Andrey Campanile, RN    14:22:57 Sedation End    Jori Moll. Andrey Campanile, RN

## 2016-08-29 NOTE — Progress Notes (Signed)
H&P by Dr. [name], dated 08/09/16, reviewed with no significant changes noted.     Airway Evaluation:     Mallampati 1  ASA 2  Neck-full range of motion  No dentures or dental appliances  No loose teeth     Risks and benefits of procedure including risk of bleeding, perforation, and missed lesions discussed with patient. Informed consent obtained.     Su GrandKayla Isbell, MD  General Surgery Resident, PGY2

## 2016-08-29 NOTE — Discharge Instructions (Signed)
Repeat colonoscopy in 10 years for surveillance

## 2016-09-05 ENCOUNTER — Telehealth (HOSPITAL_BASED_OUTPATIENT_CLINIC_OR_DEPARTMENT_OTHER): Payer: Self-pay | Admitting: Registered Nurse

## 2016-09-05 ENCOUNTER — Telehealth (HOSPITAL_BASED_OUTPATIENT_CLINIC_OR_DEPARTMENT_OTHER): Payer: Self-pay

## 2016-09-05 NOTE — Telephone Encounter (Signed)
-----   Message from Allyne GeeMichael Hickey sent at 09/05/2016  1:48 PM EST -----  Regarding: Jonathan PasseyBrown Patient  Rachid Helaine ChessRoza [phone], 45 year old, male    Calls today:  Patient calling w/ interpreter today to get the results from his 12/12 Colonoscopy  Person calling on behalf of patient: Patient (self)    Cleotis LemaCALL BACK NUMBER: [phone]    Patient's language of care: [place]    Patient needs a [place] interpreter.    Patient's PCP: [name], MD, MD

## 2016-09-05 NOTE — Progress Notes (Signed)
Pt seeking results of his colonoscopy from 12/12. Encouraged to call GI  Directly for information, provided phone number.   pt informs that he is continuing to have RLQ pain. Instructed to call GI and follow up with them especially as he's still having symptoms. He will call

## 2016-09-05 NOTE — Telephone Encounter (Signed)
-----   [facility] from [facility] N[facility] sent at 09/05/2016  1:01 PM EST -----  Regarding: FW: LOOKING FOR TEST RESULTS   Contact: [phone]      ----- Message -----  From: Araceli Boucheaniela T. Resendes  Sent: 09/05/2016  12:57 PM  To: Wynelle LinkSun Rn's  Subject: LOOKING FOR TEST RESULTS                         Nephi Helaine ChessRoza [phone], 45 year old, male    Calls today:  LOOKING FOR TEST RESULTS   Person calling on behalf of patient: Patient (self)    CALL BACK NUMBER: [phone]  Best time to call back: ANYTIME      Patient's language of care: [place]    Patient needs a [place] interpreter.    Patient's PCP: [name], MD, MD

## 2016-09-05 NOTE — Progress Notes (Signed)
message forwarded to Dr [name] and [facility]      Jonathan Helaine ChessRoza [phone], 45 year old, male     Calls today: Patient calling w/ interpreter today to get the results from his 12/12 Colonoscopy   Person calling on behalf of patient: Patient (self)

## 2016-09-05 NOTE — Telephone Encounter (Signed)
-----   Message from Allyne GeeMichael Hickey sent at 09/05/2016  1:48 PM EST -----  Regarding: [name] Patient  Jonathan [name] [id number], 45 year old, male    Calls today:  Patient calling w/ interpreter today to get the results from his 12/12 Colonoscopy  Person calling on behalf of patient: Patient (self)    Cleotis LemaCALL BACK NUMBER: [phone]    Patient's language of care: [place]Portuguese [place]    Patient needs a [place] interpreter.    Patient's PCP: [name], MD, MD

## 2016-09-06 NOTE — Progress Notes (Signed)
Please inform the pt his colonoscopy showed non bleeding internal hemorrhoids  No polyps or cancer  No explanation of his abd pain   He will need to repeat the colonoscopy in 10 yrs   If he continues to have RLQ abd pain he should f/u w/ Dr. [name]

## 2016-09-27 ENCOUNTER — Ambulatory Visit (HOSPITAL_BASED_OUTPATIENT_CLINIC_OR_DEPARTMENT_OTHER): Payer: No Typology Code available for payment source | Admitting: Internal Medicine

## 2016-09-27 ENCOUNTER — Encounter (HOSPITAL_BASED_OUTPATIENT_CLINIC_OR_DEPARTMENT_OTHER): Payer: Self-pay | Admitting: Internal Medicine

## 2016-09-27 VITALS — BP 108/71 | HR 76 | Temp 97.7°F | Wt 145.1 lb

## 2016-09-27 DIAGNOSIS — K5909 Other constipation: Principal | ICD-10-CM

## 2016-09-27 MED ORDER — POLYETHYLENE GLYCOL 3350 17 GM/SCOOP PO POWD
ORAL | 2 refills | Status: DC
Start: 2016-09-27 — End: 2016-10-12

## 2016-09-27 MED ORDER — POLYETHYLENE GLYCOL 3350 PO POWD: g | ORAL | 2 refills | 0 days | Status: DC

## 2016-09-27 MED FILL — *POLYETH GLYC POW 3350 NF: 15 days supply | Qty: 255 | Fill #0 | Status: CP

## 2016-09-27 NOTE — Progress Notes (Signed)
Jonathan [name] is a 46 year old male with the following Problems and Medications.  He comes in today with complaint of ongoing constipation.      Patient Active Problem List:     Headache disorder     Other and unspecified hyperlipidemia     Allergic rhinitis, cause unspecified     Family history of stroke (cerebrovascular)     Sciatica     Esophageal reflux      Current Outpatient Prescriptions:  polyethylene glycol (GLYCOLAX) powder Take 17g in water 1-2 times a day. Titrate to 1-2 large BMs per day Disp: 255 g Rfl: 2   PEG 3350-KCl-NaBcb-NaCl-NaSulf 227.1 g SOLR Take 4,000 mLs by mouth See Admin Instructions Disp: 1 Bottle Rfl: 0   Riboflavin 100 MG CAPS Take 100 mg by mouth daily Disp: 90 capsule Rfl: 3   capsaicin (ZOSTRIX) 0.025 % cream Apply topically 4 (four) times daily as needed Disp: 60 g Rfl: 2     No current facility-administered medications for this visit.   Review of Patient's Allergies indicates:  No Known Allergies           Constipation:  He has had issues w/chronic constipation.  He underwent colonoscopy last month which by report was normal.  He tells me he was told that he had an infection, but I cannot find any record of that or see that he was given that information in the provider's note.  He says he is still constipated.  He is not taking miralax.  I did refill this.  I asked him to f/u with the GI physician who saw him.      Review of Systems   Constitutional: Negative.    HENT: Negative.    Eyes: Negative.    Respiratory: Negative.    Cardiovascular: Negative.    Gastrointestinal: Positive for constipation.   Genitourinary: Negative.    Musculoskeletal: Negative.    Neurological: Negative.      BP 108/71  Pulse 76  Temp 97.7 F (36.5 C) (Temporal)  Wt 65.8 kg (145 lb 1 oz)  SpO2 99%  BMI 23.88 kg/m2      Physical Exam   Constitutional: He is oriented to person, place, and time and well-developed, well-nourished, and in no distress.   HENT:   Head: Normocephalic and atraumatic.   Eyes: Pupils  are equal, round, and reactive to light.   Pulmonary/Chest: Effort normal.   Abdominal: Soft. Bowel sounds are normal. He exhibits distension.   Musculoskeletal: Normal range of motion.   Neurological: He is alert and oriented to person, place, and time.   Skin: Skin is dry.       Assessment and Plan:    (K59.09) Other constipation  Comment:    Plan: polyethylene glycol (GLYCOLAX) powder             We discussed the patients current medications. The patient expressed understanding and no barriers to adherence were identified.   1. The patient indicates understanding of these issues and agrees with the plan. Brief care plan is updated and reviewed with the patient.   2. The patient is given an After Visit Summary sheet that lists all medications with directions, allergies, orders placed during this encounter, and follow-up instructions.   3. I reviewed the patient's medical information and medical history   4. I reconciled the patient's medication list and prepared and supplied needed refills.   5. I have reviewed the past medical, family, and social history sections including  the medications and allergies.

## 2016-09-27 NOTE — Patient Instructions (Signed)
Call Dr. Theora GianottiBrown's office to schedule follow up:    [phone]

## 2016-10-03 ENCOUNTER — Telehealth (HOSPITAL_BASED_OUTPATIENT_CLINIC_OR_DEPARTMENT_OTHER): Payer: Self-pay | Admitting: Family Medicine

## 2016-10-03 NOTE — Progress Notes (Signed)
[facility]    Person calling on behalf of patient: Patient (self)    May list multiple medications in this section    Medicine Name: polyethylene glycol [facility](GLYCOLAX) powder            Documented patient preferred pharmacies:       [facility]  Phone: [phone] Fax: [phone]        Patient's language of care: [place]    Patient needs a Tong[place] interpreter.      .Marland Kitchen

## 2016-10-03 NOTE — Progress Notes (Signed)
Med on file with Pharmacy:    [facility] confirmed that Polyethylene Glycol Powder has active refills remaining. No refill is required at this time.

## 2016-10-12 ENCOUNTER — Ambulatory Visit (HOSPITAL_BASED_OUTPATIENT_CLINIC_OR_DEPARTMENT_OTHER): Payer: No Typology Code available for payment source | Admitting: Gastroenterology

## 2016-10-12 VITALS — BP 121/74 | HR 96 | Temp 98.3°F | Wt 144.0 lb

## 2016-10-12 DIAGNOSIS — K5909 Other constipation: Secondary | ICD-10-CM

## 2016-10-12 DIAGNOSIS — K5900 Constipation, unspecified: Principal | ICD-10-CM

## 2016-10-12 MED ORDER — PSYLLIUM 58.6 % PO POWD: 1 | packet | Freq: Every day | ORAL | 3 refills | 0 days | Status: DC

## 2016-10-12 MED ORDER — PSYLLIUM 58.6 % PO POWD
1.0000 | Freq: Every day | ORAL | 3 refills | Status: DC
Start: 2016-10-12 — End: 2016-10-12

## 2016-10-12 MED ORDER — POLYETHYLENE GLYCOL 3350 PO POWD: g | ORAL | 2 refills | 0 days | Status: DC

## 2016-10-12 MED ORDER — PSYLLIUM 58.6 % PO POWD
1.0000 | Freq: Every day | ORAL | 3 refills | Status: DC
Start: 2016-10-12 — End: 2016-12-05

## 2016-10-12 MED ORDER — POLYETHYLENE GLYCOL 3350 17 GM/SCOOP PO POWD
ORAL | 2 refills | Status: DC
Start: 2016-10-12 — End: 2016-10-12

## 2016-10-12 MED ORDER — POLYETHYLENE GLYCOL 3350 17 GM/SCOOP PO POWD
ORAL | 2 refills | Status: DC
Start: 2016-10-12 — End: 2016-12-05

## 2016-10-12 MED FILL — *POLYETH GLYC POW 3350 NF: 7 days supply | Qty: 255 | Fill #0 | Status: CP

## 2016-10-15 NOTE — Progress Notes (Signed)
Quick Summary/Overview:      [name], MD, MD  [address]  [place] [address] [facility] [address]  [place], [place] [id number]    Dear Dr. [name], MD, MD,    I wish to thank you for referring your patient.  ___________________________________  REASON FOR CONSULT:   I am asked to see our patient in clinical consultation for evaluation of a chief complaint of Constipation, unspecified constipation type  (primary encounter diagnosis)  Other constipation          CHIEF COMPLAINT   Evaluation of this chief complaint led to the following visit diagnoses:   Constipation, unspecified constipation type  (primary encounter diagnosis)  Other constipation    HPI  46 year old male with a history of chronic constipation and abdominal discomfort. He presented today for follow-up after recent colonoscopy. Colonoscopy only showed evidence of internal hemorrhoids and no other mass lesions. Patient is otherwise clinically stable.    PAST MEDICAL HISTORY   Patient Active Problem List:     Headache disorder     Other and unspecified hyperlipidemia     Allergic rhinitis, cause unspecified     Family history of stroke (cerebrovascular)     Sciatica     Esophageal reflux        SURGICAL HISTORY  Past Surgical History:  3/05: EXCISION/TRANSPOSITION PTERYGIUM W/O GRAFT      Comment: Dr Nathaneil CanaryPatalano    CURRENT MEDICATIONS    Current Outpatient Prescriptions:  psyllium (METAMUCIL SMOOTH TEXTURE) 58.6 % powder Take 1 packet by mouth daily Disp: 30 packet Rfl: 3   polyethylene glycol (GLYCOLAX) powder Take 17g in water 1-2 times a day. Titrate to 1-2 large BMs per day Disp: 255 g Rfl: 2   PEG 3350-KCl-NaBcb-NaCl-NaSulf 227.1 g SOLR Take 4,000 mLs by mouth See Admin Instructions Disp: 1 Bottle Rfl: 0   Riboflavin 100 MG CAPS Take 100 mg by mouth daily Disp: 90 capsule Rfl: 3   capsaicin (ZOSTRIX) 0.025 % cream Apply topically 4 (four) times daily as needed Disp: 60 g Rfl: 2     No current facility-administered medications for this visit.      ALLERGIES   Review of Patient's Allergies indicates:  No Known Allergies      FAMILY HISTORY     Family History    Stroke Father     Comment: [id number]    GI Mother     Comment: died [id number] after ?GI surgery    Blood Disease Brother     Comment: not sure what        SOCIAL HISTORY   Smoking status: Never Smoker                                                              Alcohol use: No                    REVIEW OF SYSTEMS PHYSICAL EXAM   Review of Systems:  All systems were otherwise negative except for the GI and other systems documented in the History of the Present Illness.    __________________________________________________  Physical Exam:  The patient is sitting in the examining table and appears to be in no apparent distress.  BP 121/74  Pulse 96  Temp 98.3 F (  36.8 C) (Oral)  Wt 65.3 kg (144 lb)  SpO2 99%  BMI 23.7 kg/m2  HEENT: Normocephalic atraumatic  Oropharynx: Clear with no erythema or exudates  Neck: Supple no palpable lymphadenopathy in the neck or the axilla  Lungs: Clear to ausculation bilaterally  Heart: Regular rate and rhythm,  no murmurs rubs or gallops  ZOX:WRUE non tender with positive bowel sounds.  Extremities: No swelling or edema  Skin: No rashes or lesions  Neuro: Alert and oriented to person , place and time  _____________________________________________     ASSESSMENT/  PLAN, Test/Procedures ordered and recommended follow-up  :.:. 46 year old male with a history of chronic constipation. He will continue on fiber supplementation for treatment of his chronic constipation. The fiber supplement will also help to treat his internal hemorrhoids. It is recommended that he have a repeat colonoscopy in 10 years. His health maintenance profile has been updated to reflect this recommendation.

## 2016-11-07 ENCOUNTER — Ambulatory Visit (HOSPITAL_BASED_OUTPATIENT_CLINIC_OR_DEPARTMENT_OTHER): Payer: No Typology Code available for payment source | Admitting: Gastroenterology

## 2016-12-05 ENCOUNTER — Ambulatory Visit (HOSPITAL_BASED_OUTPATIENT_CLINIC_OR_DEPARTMENT_OTHER): Payer: No Typology Code available for payment source | Admitting: Gastroenterology

## 2016-12-05 VITALS — BP 123/88 | HR 72 | Temp 97.9°F | Wt 148.0 lb

## 2016-12-05 DIAGNOSIS — R1031 Right lower quadrant pain: Secondary | ICD-10-CM

## 2016-12-05 DIAGNOSIS — K5900 Constipation, unspecified: Principal | ICD-10-CM

## 2016-12-05 NOTE — Progress Notes (Signed)
Quick Summary/Overview:      Lawana Chambers, MD, MD  [address]  [place] [address]  [place], [place] [address]    Dear Dr. Lawana Chambers, MD, MD,    I wish to thank you for referring your patient.  ___________________________________  REASON FOR CONSULT:   I am asked to see our patient in clinical consultation for evaluation of a chief complaint of Constipation, unspecified constipation type  (primary encounter diagnosis)  Right lower quadrant pain          CHIEF COMPLAINT   Evaluation of this chief complaint led to the following visit diagnoses:   Constipation, unspecified constipation type  (primary encounter diagnosis)  Right lower quadrant pain    HPI  This is a follow-up visit for this 46 year old male she has a history of constipation and right lower quadrant abdominal pain. She's undergone biofeedback therapy and evaluation. Her right lower quadrant, nothing is intermittent nature. I explained to her that will likely continue to treat her with a combination of probiotic therapy, fiber and stress reduction. She states that while taking the fiber and some of the other things her symptoms have improved slightly. She is otherwise stable.    PAST MEDICAL HISTORY   Patient Active Problem List:     Headache disorder     Other and unspecified hyperlipidemia     Allergic rhinitis, cause unspecified     Family history of stroke (cerebrovascular)     Sciatica     Esophageal reflux        SURGICAL HISTORY  Past Surgical History:  3/05: EXCISION/TRANSPOSITION PTERYGIUM W/O GRAFT      Comment: Dr Nathaneil Canary    CURRENT MEDICATIONS    No current outpatient prescriptions on file.  No current facility-administered medications for this visit.     ALLERGIES   Review of Patient's Allergies indicates:  No Known Allergies      FAMILY HISTORY     Family History    Stroke Father     Comment: 40    GI Mother     Comment: died 27 after ?GI surgery    Blood Disease Brother     Comment: not sure what        SOCIAL HISTORY    Smoking status: Never Smoker                                                              Alcohol use: No                    REVIEW OF SYSTEMS PHYSICAL EXAM   Review of Systems:  All systems were otherwise negative except for the GI and other systems documented in the History of the Present Illness.    __________________________________________________  Physical Exam:  The patient is sitting in the examining table and appears to be in no apparent distress.  BP 123/88  Pulse 72  Temp 97.9 F (36.6 C) (Oral)  Wt 67.1 kg (148 lb)  SpO2 96%  BMI 24.36 kg/m2  HEENT: Normocephalic atraumatic  Oropharynx: Clear with no erythema or exudates  Neck: Supple no palpable lymphadenopathy in the neck or the axilla  Lungs: Clear to ausculation bilaterally  Heart: Regular rate and rhythm,  no murmurs rubs  or gallops  ZOX:WRUEAbd:Soft non tender with positive bowel sounds.  Extremities: No swelling or edema  Skin: No rashes or lesions  Neuro: Alert and oriented to person , place and time  _____________________________________________     ASSESSMENT/  PLAN, Test/Procedures ordered and recommended follow-up  :.:. 46 year old male with right lower quadrant abdominal pain and possible spasm. Planets for treatment with a combination of fiber therapy, probiotics can stress reduction. Colonoscopy and imaging studies have not shown any other significant inflammatory or structural lesions. She will follow-up in our clinic on an as-needed basis but overall I believe her symptoms are due to a combination of bowel spasm and possibly residual effects of constipation. The plan of outline is actively addressing both of these issues.

## 2016-12-05 NOTE — Progress Notes (Signed)
Here for follow up

## 2016-12-20 MED FILL — VITAMIN D3 2000UNIT: 30 days supply | Qty: 30 | Fill #0 | Status: CP

## 2016-12-20 MED FILL — BOOSTRIX INJ: 1 days supply | Qty: 1 | Fill #0 | Status: CP

## 2016-12-20 MED FILL — CLOBETASOL  CRE 0.05%: 14 days supply | Qty: 60 | Fill #0 | Status: CP

## 2016-12-26 ENCOUNTER — Telehealth (HOSPITAL_BASED_OUTPATIENT_CLINIC_OR_DEPARTMENT_OTHER): Payer: Self-pay | Admitting: Family Medicine

## 2016-12-26 NOTE — Progress Notes (Unsigned)
nitzali from [facility] called the Central Refill Department to complete a benefit analysis for the tdap Vaccine.    The vaccine is covered under the patient's prescription coverage.    Please choose 90715.2 tdap (Prior Auth/Pharmacy)

## 2016-12-27 ENCOUNTER — Ambulatory Visit (HOSPITAL_BASED_OUTPATIENT_CLINIC_OR_DEPARTMENT_OTHER): Payer: No Typology Code available for payment source | Admitting: Internal Medicine

## 2016-12-27 VITALS — BP 114/78 | HR 64 | Temp 98.0°F | Wt 146.6 lb

## 2016-12-27 DIAGNOSIS — K5909 Other constipation: Principal | ICD-10-CM

## 2016-12-27 MED ORDER — SACCHAROMYCES BOULARDII 250 MG PO CAPS
250.00 mg | ORAL_CAPSULE | Freq: Two times a day (BID) | ORAL | 3 refills | Status: AC
Start: 2016-12-27 — End: 2017-01-26

## 2016-12-27 MED ORDER — PSYLLIUM 30.9 % PO POWD: 1 | g | Freq: Every day | ORAL | 1 refills | 0 days | Status: DC

## 2016-12-27 MED ORDER — PSYLLIUM 30.9 % PO POWD: 1 | g | Freq: Every day | ORAL | 1 refills | 0 days | Status: AC

## 2016-12-27 MED ORDER — PSYLLIUM 30.9 % PO POWD
1.0000 | Freq: Every day | ORAL | 1 refills | Status: AC
Start: 2016-12-27 — End: 2017-01-26

## 2016-12-27 MED ORDER — SACCHAROMYCES BOULARDII 250 MG PO CAPS: 250 mg | capsule | Freq: Two times a day (BID) | ORAL | 3 refills | 0 days | Status: AC

## 2016-12-27 MED ORDER — PSYLLIUM 30.9 % PO POWD
1.0000 | Freq: Every day | ORAL | 1 refills | Status: DC
Start: 2016-12-27 — End: 2016-12-27

## 2016-12-27 NOTE — Progress Notes (Signed)
Jonathan [name] is a 46 year old male with the following Problems and Medications.  He presents today with ongoing abdominal pain.        Patient Active Problem List:     Headache disorder     Other and unspecified hyperlipidemia     Allergic rhinitis, cause unspecified     Family history of stroke (cerebrovascular)     Sciatica     Esophageal reflux      Current Outpatient Prescriptions:  saccharomyces boulardii (FLORASTOR) 250 MG capsule Take 1 capsule by mouth 2 (two) times daily Disp: 60 capsule Rfl: 3   Psyllium (METAMUCIL) 30.9 % POWD Take 1 Dose by mouth daily Disp: 822 g Rfl: 1     No current facility-administered medications for this visit.   Review of Patient's Allergies indicates:  No Known Allergies       Abdominal Pain:  He presents today with ongoing abdominal pain.  He has had an extensive w/u by GI at [facility] including colonoscopy.  The working diagnosis is colonic spasm w/chronic constipation.  He has not initiated the medications recommended by GI.  He requests a second opinion.  I think a second opinion is reasonable.  I have referred him to [facility].  I have asked him to initiate the probiotics and fiber suggested by GI.  I have sent prescriptions to the [facility].    Review of Systems   Constitutional: Negative.    HENT: Negative.    Eyes: Negative.    Respiratory: Negative.    Cardiovascular: Negative.    Gastrointestinal: Positive for abdominal pain and constipation.   Endocrine: Negative.    Genitourinary: Negative.    Musculoskeletal: Negative.    Skin: Negative.    Neurological: Negative.        BP 114/78  Pulse 64  Temp 98 F (36.7 C) (Temporal)  Wt 66.5 kg (146 lb 9.6 oz)  SpO2 99%  BMI 24.13 kg/m2      Physical Exam   Constitutional: He is oriented to person, place, and time and well-developed, well-nourished, and in no distress.   HENT:   Head: Normocephalic and atraumatic.   Eyes: Pupils are equal, round, and reactive to light.   Pulmonary/Chest: Effort normal.    Abdominal: Soft. Bowel sounds are normal. He exhibits no distension. There is no tenderness.   Musculoskeletal: Normal range of motion.   Neurological: He is alert and oriented to person, place, and time.   Skin: Skin is warm and dry.         Assessment and Plan:    (K59.09) Chronic constipation  Comment:    Plan: REFERRAL TO GASTROENTEROLOGY (EXT),         saccharomyces boulardii (FLORASTOR) 250 MG         capsule, Psyllium (METAMUCIL) 30.9 % POWD,         DISCONTINUED: Psyllium (METAMUCIL) 30.9 % POWD             We discussed the patients current medications. The patient expressed understanding and no barriers to adherence were identified.   1. The patient indicates understanding of these issues and agrees with the plan. Brief care plan is updated and reviewed with the patient.   2. The patient is given an After Visit Summary sheet that lists all medications with directions, allergies, orders placed during this encounter, and follow-up instructions.   3. I reviewed the patient's medical information and medical history   4. I reconciled the patient's  medication list and prepared and supplied needed refills.   5. I have reviewed the past medical, family, and social history sections including the medications and allergies.

## 2017-03-27 ENCOUNTER — Ambulatory Visit (HOSPITAL_BASED_OUTPATIENT_CLINIC_OR_DEPARTMENT_OTHER): Payer: No Typology Code available for payment source | Admitting: Internal Medicine

## 2017-03-27 ENCOUNTER — Encounter (HOSPITAL_BASED_OUTPATIENT_CLINIC_OR_DEPARTMENT_OTHER): Payer: Self-pay | Admitting: Internal Medicine

## 2017-03-27 VITALS — BP 124/76 | HR 104 | Temp 99.5°F | Wt 147.4 lb

## 2017-03-27 DIAGNOSIS — K581 Irritable bowel syndrome with constipation: Principal | ICD-10-CM

## 2017-03-27 MED ORDER — RIFAXIMIN 200 MG PO TABS: 200 mg | tablet | Freq: Three times a day (TID) | ORAL | 0 refills | 0 days | Status: DC

## 2017-03-27 MED ORDER — MAGNESIUM CITRATE PO SOLN
296.00 mL | Freq: Once | ORAL | 0 refills | Status: AC
Start: 2017-03-27 — End: 2017-03-27

## 2017-03-27 MED ORDER — MAGNESIUM CITRATE PO SOLN: 296 mL | mL | Freq: Once | ORAL | 0 refills | 0 days | Status: AC

## 2017-03-27 MED ORDER — RIFAXIMIN 200 MG PO TABS
200.0000 mg | ORAL_TABLET | Freq: Three times a day (TID) | ORAL | 0 refills | Status: DC
Start: 2017-03-27 — End: 2017-04-10

## 2017-03-27 MED FILL — XIFAXAN   200MG: 14 days supply | Qty: 42 | Fill #0 | Status: CP

## 2017-03-27 NOTE — Progress Notes (Signed)
Jonathan [name] is a 46 year old male with the following Problems and Medications.  Jonathan [name] comes in today with ongoing constipation and abdominal discomfort.     Patient Active Problem List:     Headache disorder     Other and unspecified hyperlipidemia     Allergic rhinitis, cause unspecified     Family history of stroke (cerebrovascular)     Sciatica     Esophageal reflux      Current Outpatient Prescriptions:  magnesium citrate solution Take 296 mLs by mouth once Disp: 296 mL Rfl: 0   rifaximin (XIFAXAN) 200 MG tablet Take 1 tablet by mouth 3 (three) times daily Disp: 42 tablet Rfl: 0     No current facility-administered medications for this visit.   Review of Patient's Allergies indicates:  No Known Allergies         Abdominal Discomfort/Chronic constipation:  I saw him last in April of this year.  Jonathan [name] has been waiting since then for [facility] app[facility] for a second opinion from GI.  Jonathan [name] has an appt on 8/1.  Today Jonathan [name] presents with worsening constipation.  Jonathan [name] has intermittent pain.  His abdominal exam is benign.  I will give him mag citrate for the acute constipation. We can do a brief trial of rifaximin for two weeks. I will see him back in advance of his [facility] appointment to see how Jonathan [name] has fared.    Review of Systems   Constitutional: Negative.    HENT: Negative.    Eyes: Negative.    Respiratory: Negative.    Gastrointestinal: Positive for abdominal pain and constipation.   Endocrine: Negative.    Genitourinary: Negative.    Musculoskeletal: Negative.    Skin: Negative.    Neurological: Negative.      BP 124/76  Pulse 104  Temp 99.5 F (37.5 C) (Temporal)  Wt 66.9 kg (147 lb 6.4 oz)  SpO2 98%  BMI 24.26 kg/m2      Physical Exam   Constitutional: Jonathan [name] is oriented to person, place, and time and well-developed, well-nourished, and in no distress.   HENT:   Head: Normocephalic and atraumatic.   Neck: Normal range of motion.   Pulmonary/Chest: Effort normal.   Abdominal: Soft. Jonathan [name] exhibits distension. There is no tenderness. There is  no rebound.   Musculoskeletal: Normal range of motion.   Neurological: Jonathan [name] is alert and oriented to person, place, and time.   Skin: Skin is warm and dry.           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 The patient expressed understanding and no barriers to adherence were identified.   1. The patient indicates understanding of these issues and agrees with the plan. Brief care plan is updated and reviewed with the patient.   2. The patient is given an After Visit Summary sheet that lists all medications with directions, allergies, orders placed during this encounter, and follow-up instructions.   3. I reviewed the patient's medical information and medical history   4. I reconciled the patient's medication list and prepared and supplied needed refills.   5. I have reviewed the past medical, family, and social history sections including the medications and allergies.

## 2017-04-09 ENCOUNTER — Telehealth (HOSPITAL_BASED_OUTPATIENT_CLINIC_OR_DEPARTMENT_OTHER): Payer: Self-pay | Admitting: Family Medicine

## 2017-04-09 NOTE — Progress Notes (Unsigned)
Chelsea from [facility] called the Central Refill Department to complete a benefit analysis for the TDAP Vaccine.      The vaccine is covered under the patient’s prescription coverage.    Please choose 90715.2 (Prior Auth/Pharmacy).

## 2017-04-10 ENCOUNTER — Encounter (HOSPITAL_BASED_OUTPATIENT_CLINIC_OR_DEPARTMENT_OTHER): Payer: Self-pay | Admitting: Internal Medicine

## 2017-04-10 ENCOUNTER — Ambulatory Visit (HOSPITAL_BASED_OUTPATIENT_CLINIC_OR_DEPARTMENT_OTHER): Payer: No Typology Code available for payment source | Admitting: Internal Medicine

## 2017-04-10 VITALS — BP 100/65 | HR 86 | Temp 98.4°F | Wt 148.4 lb

## 2017-04-10 DIAGNOSIS — K581 Irritable bowel syndrome with constipation: Principal | ICD-10-CM

## 2017-04-10 MED ORDER — RIFAXIMIN 200 MG PO TABS: 200 mg | tablet | Freq: Three times a day (TID) | ORAL | 1 refills | 0 days | Status: DC

## 2017-04-10 MED ORDER — RIFAXIMIN 200 MG PO TABS
200.0000 mg | ORAL_TABLET | Freq: Three times a day (TID) | ORAL | 1 refills | Status: DC
Start: 2017-04-10 — End: 2017-12-18

## 2017-04-10 MED FILL — XIFAXAN   200MG: 30 days supply | Qty: 90 | Fill #0 | Status: CP

## 2017-04-10 NOTE — Progress Notes (Signed)
Jonathan Jonathan [name] is a 46 year old Jonathan [name] with the following Problems and Medications.  He returns today in follow up regarding IBS.    Patient Active Problem List:     Headache disorder     Other and unspecified hyperlipidemia     Allergic rhinitis, cause unspecified     Family history of stroke (cerebrovascular)     Sciatica     Esophageal reflux      Current Outpatient Prescriptions:  rifaximin (XIFAXAN) 200 MG tablet Take 1 tablet by mouth 3 (three) times daily Disp: 180 tablet Rfl: 1     No current facility-administered medications for this visit.   Review of Patient's Allergies indicates:  No Known Allergies     IBS:  He comes in today for 2 week f/u of IBS.  I started him on rifaximin. He tells me his abdominal pain, while still present, is markedly better.  We will continue this.  He is [facility] a GI [facility].  He will f/u w/me after that.    Review of Systems   Constitutional: Negative.    HENT: Negative.    Eyes: Negative.    Respiratory: Negative.    Cardiovascular: Negative.    Gastrointestinal: Positive for abdominal pain.   Endocrine: Negative.    Genitourinary: Negative.    Musculoskeletal: Negative.    Skin: Negative.    Neurological: Negative.        BP 100/65 (Site: LA, Position: Sitting, Cuff Size: Reg)  Pulse 86  Temp 98.4 F (36.9 C) (Temporal)  Wt 67.3 kg (148 lb 6.4 oz)  SpO2 100%  BMI 24.43 kg/m2      Physical Exam   Constitutional: He is oriented to person, place, and time and well-developed, well-nourished, and in no distress.   HENT:   Head: Normocephalic and atraumatic.   Eyes: Pupils are equal, round, and reactive to light.   Neck: Normal range of motion.   Pulmonary/Chest: Effort normal.   Abdominal: Soft. Bowel sounds are normal. He exhibits no distension. There is no tenderness.   Musculoskeletal: Normal range of motion.   Neurological: He is alert and oriented to person, place, and time.   Skin: Skin is warm and dry.         Assessment and Plan:    (K58.1) Irritable bowel syndrome  with constipation  Comment:    Plan: rifaximin (XIFAXAN) 200 MG tablet             We discussed the patients current medications. The patient expressed understanding and no barriers to adherence were identified.   1. The patient indicates understanding of these issues and agrees with the plan. Brief care plan is updated and reviewed with the patient.   2. The patient is given an After Visit Summary sheet that lists all medications with directions, allergies, orders placed during this encounter, and follow-up instructions.   3. I reviewed the patient's medical information and medical history   4. I reconciled the patient's medication list and prepared and supplied needed refills.   5. I have reviewed the past medical, family, and social history sections including the medications and allergies.

## 2017-05-30 DIAGNOSIS — K589 Irritable bowel syndrome without diarrhea: Secondary | ICD-10-CM | POA: Insufficient documentation

## 2017-11-26 ENCOUNTER — Encounter (HOSPITAL_BASED_OUTPATIENT_CLINIC_OR_DEPARTMENT_OTHER): Payer: Self-pay | Admitting: Family Medicine

## 2017-11-26 ENCOUNTER — Ambulatory Visit (HOSPITAL_BASED_OUTPATIENT_CLINIC_OR_DEPARTMENT_OTHER): Payer: Medicaid Other | Admitting: Family Medicine

## 2017-11-26 ENCOUNTER — Ambulatory Visit (HOSPITAL_BASED_OUTPATIENT_CLINIC_OR_DEPARTMENT_OTHER): Payer: Self-pay | Admitting: Clinic/Center

## 2017-11-26 VITALS — BP 122/83 | HR 78 | Temp 98.4°F | Ht 65.24 in | Wt 144.2 lb

## 2017-11-26 DIAGNOSIS — R079 Chest pain, unspecified: Principal | ICD-10-CM

## 2017-11-26 MED ORDER — NAPROXEN 500 MG PO TABS: 500 mg | tablet | Freq: Two times a day (BID) | ORAL | 0 refills | 0 days | Status: AC

## 2017-11-26 MED ORDER — NAPROXEN 500 MG PO TABS
500.0000 mg | ORAL_TABLET | Freq: Two times a day (BID) | ORAL | 0 refills | Status: DC
Start: 2017-11-26 — End: 2019-12-08

## 2017-11-26 NOTE — Telephone Encounter (Signed)
Answer Assessment - Initial Assessment Questions  1. LOCATION: "Where does it hurt?"        Right side of the chest  2. RADIATION: "Does the pain go anywhere else?" (e.g., into neck, jaw, arms, back)   no  3. ONSET: "When did the chest pain begin?" (Minutes, hours or days)   Last night at 7 PM  4. PATTERN "Does the pain come and go, or has it been constant since it started?"  "Does it get worse with exertion?"   intermittent  5. DURATION: "How long does it last" (e.g., seconds, minutes, hours)    minutes  6. SEVERITY: "How bad is the pain?"  (e.g., Scale 1-10; mild, moderate, or severe)     - MILD (1-3): doesn't interfere with normal activities      - MODERATE (4-7): interferes with normal activities or awakens from sleep     - SEVERE (8-10): excruciating pain, unable to do any normal activities    moderated  7. CARDIAC RISK FACTORS: "Do you have any history of heart problems or risk factors for heart disease?" (e.g., prior heart attack, angina; high blood pressure, diabetes, being overweight, high cholesterol, smoking, or strong family history of heart disease)  no  8. PULMONARY RISK FACTORS: "Do you have any history of lung disease?"  (e.g., blood clots in lung, asthma, emphysema, birth control pills)  no  9. CAUSE: "What do you think is causing the chest pain?"  unsure  10. OTHER SYMPTOMS: "Do you have any other symptoms?" (e.g., dizziness, nausea, vomiting, sweating, fever, difficulty breathing, cough)  Yesterday, SOB  11. PREGNANCY: "Is there any chance you are pregnant?" "When was your last menstrual period?"  n/a    Protocols used: ADULT CHEST PAIN-A-AH

## 2017-11-26 NOTE — Progress Notes (Signed)
CC: right sided chest pain      #)chest pain  -began last night  -on right side  -has been continuous, was worse yesterday  -hurts a little bit more with deep breaths  -not sure if has dyspnea; [place] [address] bus stop without any respiratory difficulties   -works at [employer], works on [employer] all day  -no recent air travel or prolonged sitting  -does report getting out of breath for past two or three weeks if he runs; haves some leg pain    Of note, pt very difficult to get clear history out from    ROS:  -denies any recent illness        O:  BP 122/83  Pulse 78  Temp 98.4 F (36.9 C) (Temporal)  Ht 5' 5.24" (1.657 m)  Wt 65.4 kg (144 lb 3.2 oz)  SpO2 96%  BMI 23.82 kg/m2  Gen: NAD  HEENT: MMM  CV: S1, S2, RRR  Pulm: CTAB, not tachypnic  Psych: normal speech and thought  Skin: no rash  Ext: no lower extremity edema, no swelling in either limb    EKG: sinus rhythm without any visible abnormalities    (R07.9) Right-sided chest pain  Comment: pt very well appearing and very difficult to obtain history from. I doubt that this is a pulmonary embolism given pt's relative comfort in office, normal vital signs, and lack of provoking events. Pt does have brother with some unknown blood disease but I would imagine a hereditary hematologic condition predisposing to thromobotic events would have demonstrated itself by now in a 47 year old.  EKG reviewed by me and normal, no findings c/w acute cardiac or PE-type pathology.  Suspect MSK in nature, will treat with naproxen BID for one week and pt told to return to follow up. RTC sooner if sx worsen  Plan: EKG          I have spent 25 minutes in face to face time with this patient/patient proxy of which > 50% was in counseling or coordination of care regarding above issues/Dx.

## 2017-11-26 NOTE — Progress Notes (Signed)
Call via [place] telephone interpreter     47 YO male  No cardia history  Called to report he had R side chest pain yesterday with SOB  Patient reports the pain was worse when taking a deep breath  denies nay injuries, shoveling of snow or lefting of weight     Denies having any chest pain, jaw, or shoulder pain at this time. No SOB or difficulties breathing     [facility] appointment schedule with Dr. Theone StanleyStricesk for today at 1:20 PM  Reasons of r ER visit discussed

## 2017-11-27 LAB — EKG

## 2017-12-06 ENCOUNTER — Telehealth (HOSPITAL_BASED_OUTPATIENT_CLINIC_OR_DEPARTMENT_OTHER): Payer: Self-pay

## 2017-12-06 NOTE — Progress Notes (Unsigned)
nurse from [facility] called the Central Refill Department to complete a benefit analysis for the tdap Vaccine.    The vaccine is covered under the patient’s hsn medical coverage.    Please choose Private

## 2017-12-07 ENCOUNTER — Encounter (HOSPITAL_BASED_OUTPATIENT_CLINIC_OR_DEPARTMENT_OTHER): Payer: Self-pay | Admitting: Family Medicine

## 2017-12-07 ENCOUNTER — Ambulatory Visit (HOSPITAL_BASED_OUTPATIENT_CLINIC_OR_DEPARTMENT_OTHER): Payer: Medicaid Other | Admitting: Family Medicine

## 2017-12-07 VITALS — BP 121/81 | HR 80 | Temp 98.1°F | Wt 144.2 lb

## 2017-12-07 DIAGNOSIS — R0602 Shortness of breath: Secondary | ICD-10-CM

## 2017-12-07 DIAGNOSIS — R0789 Other chest pain: Principal | ICD-10-CM

## 2017-12-07 DIAGNOSIS — K5901 Slow transit constipation: Secondary | ICD-10-CM

## 2017-12-07 NOTE — Progress Notes (Addendum)
CC: 1 wk CP f/u    #Chest pain  - 2 wks ago here for R-sided CP. Better since starting 7-day naproxen course.   - No CP since past visit.    #SOB  - Palpitations when running or very tired - occurring every day. "Feel SOB, tired, and heart beating when I run. Also, when very tired."  - Running 3x/wk (runs 6-10 mins)    #Abdominal pain  - Feels constipated.  - H/o IBS-C. Rx'd rifaximin & miralax. (ran out of both and has not taken for months).  - Takes psyllium and 1 natural laxative from [place] daily w/ no relief. (Has been taking for >6 months).  - "when I go to the bathroom, stools are soft and dark and smell is awful."  - 6-7 on [employer] Stool Chart.  - 2 BMs today, but none for past 2 days.  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  - Pain now 2-3/10 esp when can't go to bathroom.  - 24-hr diet recall: peanut cereal bar, mashed potato, rice, salad, chicken. 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  - Saw GI in Aug 2018 and Dx'd w/ slow transit constipation. Was Rx'd miralax and constipation and abd pain stopped. Was instructed to take indefinitely and then reevaluate need for rifaximin.     Review of Systems   Constitutional: Negative for fever.   Respiratory:        SOB associated w/ exercise   Cardiovascular: Negative for chest pain.        Can feel heart beating when exercising   Gastrointestinal: Positive for abdominal pain, constipation, diarrhea and nausea. Negative for blood in stool and vomiting.   Neurological: Negative for headaches.   BP 121/81  Pulse 80  Temp 98.1 F (36.7 C) (Temporal)  Wt 65.4 kg (144 lb 3.2 oz)  BMI 23.82 kg/m2  Physical Exam   Constitutional: He is oriented to person, place, and time. He appears well-developed and well-nourished.   HENT:   Head: Normocephalic and atraumatic.   Cardiovascular: Normal rate, regular rhythm and normal heart sounds.   Pulmonary/Chest: Effort normal and breath sounds  normal.   Abdominal: Soft. Normal appearance and bowel sounds are normal. There is no tenderness.   Musculoskeletal: Normal range of motion.   Neurological: He is alert and oriented to person, place, and time.   Skin: Skin is warm and dry.   Psychiatric: He has a normal mood and affect. His behavior is normal.     A&P/  #Chest pain  - Likely M/S. Responded well to naproxen x7d. Has not had any symptoms since starting Tx.  - If pain returns take naproxen, if persists despite naproxen, get eval'd.    #SOB  - Associated w/ exercise. Likely d/t poor conditioning.  - Encouraged importance of increased physical conditioning.  - Suggested exercising for shorter duration, but more frequently and then slowly increasing duration.    #Slow transit constipation  - Dx per GI in August 2018. At the time, symptoms were relieved w/ miralax.  - Rx miralax  - Will hold off on rifaximin for now, but if symptoms don't improve w/ miralax will consider adding at next visit.  - Educated on benefit of adding more colors (produce) to diet.  - Counseled on importance of adequate hydration.    I personally performed the physical exam and medical decision making portion of this visit and was  physically present to confirm & verify the documentation of history, physical exam, and medical decision making documentation provided by the student.   Selena LesserJeremy C. Eberardo Demello,MD, 12/10/2017, 2:55 PM    I have spent 25 minutes in face to face time with this patient/patient proxy of which > 50% was in counseling or coordination of care regarding above issues/Dx.

## 2017-12-10 ENCOUNTER — Encounter (HOSPITAL_BASED_OUTPATIENT_CLINIC_OR_DEPARTMENT_OTHER): Payer: Self-pay | Admitting: Family Medicine

## 2017-12-10 MED ORDER — POLYETHYLENE GLYCOL 3350 17 GM/SCOOP PO POWD
17.00 g | Freq: Every day | ORAL | 0 refills | Status: AC
Start: 2017-12-10 — End: 2018-03-12

## 2017-12-10 MED ORDER — POLYETHYLENE GLYCOL 3350 PO POWD: 17 g | g | Freq: Every day | ORAL | 0 refills | 0 days | Status: AC

## 2017-12-10 MED FILL — PEG3350 POW: 30 days supply | Qty: 510 | Fill #0 | Status: CP

## 2017-12-18 ENCOUNTER — Ambulatory Visit (HOSPITAL_BASED_OUTPATIENT_CLINIC_OR_DEPARTMENT_OTHER): Payer: Medicaid Other | Admitting: Internal Medicine

## 2017-12-18 ENCOUNTER — Encounter (HOSPITAL_BASED_OUTPATIENT_CLINIC_OR_DEPARTMENT_OTHER): Payer: Self-pay | Admitting: Internal Medicine

## 2017-12-18 VITALS — BP 122/79 | HR 83 | Temp 98.5°F | Ht 65.3 in | Wt 145.2 lb

## 2017-12-18 DIAGNOSIS — K581 Irritable bowel syndrome with constipation: Secondary | ICD-10-CM

## 2017-12-18 MED ORDER — RIFAXIMIN 200 MG PO TABS: 200 mg | tablet | Freq: Three times a day (TID) | ORAL | 1 refills | 0 days | Status: AC

## 2017-12-18 MED ORDER — RIFAXIMIN 200 MG PO TABS
200.00 mg | ORAL_TABLET | Freq: Three times a day (TID) | ORAL | 1 refills | Status: AC
Start: 2017-12-18 — End: 2018-02-16

## 2017-12-18 MED FILL — XIFAXAN   200MG: 60 days supply | Qty: 180 | Fill #0 | Status: CP

## 2017-12-18 NOTE — Progress Notes (Signed)
Jonathan [name] is a 47 year old male with the following Problems and Medications.  Comes in today with "stomach pain".    Patient Active Problem List:     Headache disorder     Other and unspecified hyperlipidemia     Allergic rhinitis, cause unspecified     Family history of stroke (cerebrovascular)     Sciatica     Esophageal reflux      Current Outpatient Medications:  rifaximin (XIFAXAN) 200 MG tablet Take 1 tablet by mouth 3 (three) times daily Disp: 180 tablet Rfl: 1   polyethylene glycol (GLYCOLAX) powder Take 17 g by mouth daily Disp: 578 g Rfl: 0   naproxen (NAPROSYN) 500 MG tablet Take 1 tablet by mouth 2 (two) times daily with meals for 7 days Disp: 14 tablet Rfl: 0     No current facility-administered medications for this visit.   Review of Patient's Allergies indicates:  No Known Allergies         Stomach Pain:  Returns w/stomach pain.  Has hx of IBS.  Was previously on xifaxan.  Now only on miralax.  Will restart xifaxan.  He will f/u w/me in one month.  Encouraged him to limit NSAID use, and use tylenol if has pain.    Review of Systems   Constitutional: Negative.    HENT: Negative.    Eyes: Negative.    Respiratory: Negative.    Cardiovascular: Negative.    Gastrointestinal: Positive for abdominal pain.   Endocrine: Negative.    Genitourinary: Negative.    Musculoskeletal: Negative.    Skin: Negative.    Neurological: Negative.    Hematological: Negative.      BP 122/79  Pulse 83  Temp 98.5 F (36.9 C) (Temporal)  Ht 5' 5.3" (1.659 m)  Wt 65.9 kg (145 lb 3.2 oz)  SpO2 97%  BMI 23.94 kg/m2      Physical Exam   Constitutional: He is oriented to person, place, and time and well-developed, well-nourished, and in no distress.   HENT:   Head: Normocephalic and atraumatic.   Eyes: Pupils are equal, round, and reactive to light.   Neck: Normal range of motion.   Pulmonary/Chest: Effort normal.   Musculoskeletal: Normal range of motion.   Neurological: He is alert and oriented to person, place, and time.    Skin: Skin is warm and dry.       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 The patient expressed understanding and no barriers to adherence were identified.   1. The patient indicates understanding of these issues and agrees with the plan. Brief care plan is updated and reviewed with the patient.   2. The patient is given an After Visit Summary sheet that lists all medications with directions, allergies, orders placed during this encounter, and follow-up instructions.   3. I reviewed the patient's medical information and medical history   4. I reconciled the patient's medication list and prepared and supplied needed refills.   5. I have reviewed the past medical, family, and social history sections including the medications and allergies.

## 2018-01-15 ENCOUNTER — Ambulatory Visit (HOSPITAL_BASED_OUTPATIENT_CLINIC_OR_DEPARTMENT_OTHER): Payer: Medicaid Other | Admitting: Internal Medicine

## 2018-01-15 ENCOUNTER — Telehealth (HOSPITAL_BASED_OUTPATIENT_CLINIC_OR_DEPARTMENT_OTHER): Payer: Self-pay | Admitting: Internal Medicine

## 2018-01-15 ENCOUNTER — Encounter (HOSPITAL_BASED_OUTPATIENT_CLINIC_OR_DEPARTMENT_OTHER): Payer: Self-pay | Admitting: Internal Medicine

## 2018-01-15 VITALS — BP 114/75 | HR 83 | Temp 98.3°F | Ht 65.32 in | Wt 146.0 lb

## 2018-01-15 DIAGNOSIS — R1084 Generalized abdominal pain: Principal | ICD-10-CM

## 2018-01-15 NOTE — Progress Notes (Signed)
Jonathan [name] is a 47 year old male with the following Problems and Medications. Presenting with abdominal pain.    Patient Active Problem List:     Headache disorder     Other and unspecified hyperlipidemia     Allergic rhinitis, cause unspecified     Family history of stroke (cerebrovascular)     Sciatica     Esophageal reflux    Current Outpatient Medications   Medication Sig    rifaximin (XIFAXAN) 200 MG tablet Take 1 tablet by mouth 3 (three) times daily    polyethylene glycol (GLYCOLAX) powder Take 17 g by mouth daily    naproxen (NAPROSYN) 500 MG tablet Take 1 tablet by mouth 2 (two) times daily with meals for 7 days     No current facility-administered medications for this visit.      Review of Patient's Allergies indicates:  No Known Allergies    Abdominal Pain   Pt has been experiencing abdominal pain. He has a hx of IBS. No relief in sx since restarting xifaxan 1 month ago. Pt was previously seen at [facility], will place referral to be seen again.       Review of Systems   Constitutional: Negative.    HENT: Negative.    Eyes: Negative.    Respiratory: Negative.    Cardiovascular: Negative.    Genitourinary: Negative.    Musculoskeletal: Negative.    Skin: Negative.    Neurological: Negative.    Endo/Heme/Allergies: Negative.    Psychiatric/Behavioral: Negative.       O  BP 114/75  Pulse 83  Temp 98.3 F (36.8 C) (Temporal)  Ht 5' 5.32" (1.659 m)  Wt 66.2 kg (146 lb)  SpO2 96%  BMI 24.06 kg/m2   Physical Exam   Constitutional: He is oriented to person, place, and time. He appears well-developed and well-nourished.   HENT:   Head: Normocephalic and atraumatic.   Eyes: Pupils are equal, round, and reactive to light. EOM are normal.   Neck: Normal range of motion. Neck supple.   Pulmonary/Chest: Effort normal and breath sounds normal.   Musculoskeletal: Normal range of motion.   Neurological: He is alert and oriented to person, place, and time.   Skin: Skin is warm and dry.   Psychiatric: He has a normal mood and  affect.     A/P  (R10.84) Generalized abdominal pain  (primary encounter diagnosis)  Comment: Ongoing abdominal pain that did not improve w/ xifaxan. Hx of IBS. Previously seen at [facility] referral placed to continue care.  Plan: REFERRAL TO GASTROENTEROLOGY (EXT)          We discussed the patients current medications. The patient expressed understanding and no barriers to adherence were identified.   1. The patient indicates understanding of these issues and agrees with the plan. Brief care plan is updated and reviewed with the patient.   2. The patient is given an After Visit Summary sheet that lists all medications with directions, allergies, orders placed during this encounter, and follow-up instructions.   3. I reviewed the patient's medical information and medical history   4. I reconciled the patient's medication list and prepared and supplied needed refills.   5. I have reviewed the past medical, family, and social history sections including the medications and allergies.    This documentation serves as a record of the services and decisions personally performed by Fortino Sic. It was created by Salome Holmes (medical scribe) and was based on the provider's statements to  me.         I attest that I have reviewed this note and that the components of the history of the present illness, the physical exam, and the assessment and plan documented were performed by me or were performed in my presence by the student and/or medical scribe and verified by me. This note is an accurate record of the services performed and decisions made by Bolivar Haw, PA-C.

## 2018-01-15 NOTE — Patient Instructions (Signed)
Call [phone] to make appointment at [facility]

## 2018-01-15 NOTE — Progress Notes (Signed)
Esther from [facility] called the Central Refill Department to complete a benefit analysis for the TDAP Vaccine.    The vaccine is covered under the patient’s [facility] medical coverage.    Please choose Private

## 2018-07-31 MED FILL — NYSTATIN   CRE 100000: 7 days supply | Qty: 30 | Fill #0 | Status: CP

## 2018-08-09 MED FILL — CLOBETASOL  CRE 0.05%: 30 days supply | Qty: 60 | Fill #0 | Status: CP

## 2019-06-19 ENCOUNTER — Other Ambulatory Visit: Payer: Self-pay

## 2019-06-19 ENCOUNTER — Ambulatory Visit
Admission: RE | Admit: 2019-06-19 | Discharge: 2019-06-19 | Disposition: A | Discharge status: Home / Self Care | Payer: No Typology Code available for payment source | Attending: Family Medicine | Admitting: Family Medicine

## 2019-06-19 ENCOUNTER — Ambulatory Visit (HOSPITAL_BASED_OUTPATIENT_CLINIC_OR_DEPARTMENT_OTHER): Payer: No Typology Code available for payment source | Admitting: Family Medicine

## 2019-06-19 ENCOUNTER — Encounter (HOSPITAL_BASED_OUTPATIENT_CLINIC_OR_DEPARTMENT_OTHER): Payer: Self-pay | Admitting: Family Medicine

## 2019-06-19 DIAGNOSIS — G8929 Other chronic pain: Secondary | ICD-10-CM

## 2019-06-19 DIAGNOSIS — M25562 Pain in left knee: Secondary | ICD-10-CM | POA: Diagnosis present

## 2019-06-19 DIAGNOSIS — M2550 Pain in unspecified joint: Secondary | ICD-10-CM

## 2019-06-19 DIAGNOSIS — M7989 Other specified soft tissue disorders: Secondary | ICD-10-CM | POA: Diagnosis not present

## 2019-06-19 MED ORDER — DICLOFENAC SODIUM 1 % TD GEL
2.00 g | Freq: Four times a day (QID) | TRANSDERMAL | 2 refills | Status: AC
Start: 2019-06-19 — End: 2019-09-17

## 2019-06-19 MED ORDER — ACETAMINOPHEN 500 MG PO TABS
500.00 mg | ORAL_TABLET | Freq: Four times a day (QID) | ORAL | 2 refills | Status: AC | PRN
Start: 2019-06-19 — End: 2019-09-17

## 2019-06-19 MED FILL — DICLOFENAC SOD GL 1% 100GM TOP: 30 days supply | Qty: 100 | Fill #0 | Status: CP

## 2019-06-19 MED FILL — ACETAMIN 500MG: 25 days supply | Qty: 100 | Fill #0 | Status: CP

## 2019-06-19 NOTE — Progress Notes (Signed)
CC: polyarthralgias     Jonathan [name] is a 48 year old male presenting for   Knees, shoulders, ankles, wrists are hurting  Knees for about 3 months, shoulders for about 2 months, ankles and wrists for about 2 weeks. Bilateral.  Knees hurt worse to squat. No morning stiffness.  Doesn't exercise much.  Affects activities only in that L knee hurts when playing soccer. Uses topical menthol ointment and L knee brace    At end of visit pt brings up ear pain when he is lying fdown       Review of Systems   Constitutional: Negative for fever.   Respiratory: Negative for cough and shortness of breath.         Patient Active Problem List:     Headache disorder     Other and unspecified hyperlipidemia     Allergic rhinitis, cause unspecified     Family history of stroke (cerebrovascular)     Sciatica     Esophageal reflux       Review of patient's family history indicates:  Problem: Stroke      Relation: Father          Age of Onset: (Not Specified)          Comment: 25  Problem: GI      Relation: Mother          Age of Onset: (Not Specified)          Comment: died 71 after ?GI surgery  Problem: Blood Disease      Relation: Brother          Age of Onset: (Not Specified)          Comment: not sure what        naproxen (NAPROSYN) 500 MG tablet, Take 1 tablet by mouth 2 (two) times daily with meals for 7 days, Disp: 14 tablet, Rfl: 0    No current facility-administered medications on file prior to visit.        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, no children            No smoking      ETOH - once a month      No drugs            Lives with brother and sister-in-law and 2 children         Exam - vitals deferred in setting of COVID pandemic  Speaking in  complete sentences, breathing comfortably       Assessment/Plan:  1. Polyarthralgia  Do not suspect unifying rheumatologic dx given symptoms are mild and joints not swollen. Would recommend weight bearing exercise, tylenol and topical NSAID as needed    2. Chronic pain of left knee  Will r/o OA but may be same as above  - XR KNEE LEFT MINIMUM 4 VIEWS; Future     As patient brought up a new issue at end of appointment, asked him to make a separate visit to discuss that in detail

## 2019-06-24 ENCOUNTER — Telehealth (HOSPITAL_BASED_OUTPATIENT_CLINIC_OR_DEPARTMENT_OTHER): Payer: Self-pay | Admitting: Clinic/Center

## 2019-06-24 ENCOUNTER — Other Ambulatory Visit: Payer: Self-pay

## 2019-06-24 NOTE — Progress Notes (Signed)
Call via [place] telephone interpreter   Patient  has been prescribed acetaminophen (TYLENOL) 500 MG tablet, every 6 (six) hours as and diclofenac (VOLTAREN) 1 % GEL Gel for knee pain    Patient called to report that he stopped taking the tylenol because he believes it is causing symptoms of  SCHISTOSOMAL  " I had this in the past"  "I am taking 200 mg of the Ibuprofen instead."    Patient educated that the doctor prescribed tylenol because the diclofenac and Ibuprofen are both NSAID and did not want to prescribed two NSAID medication    Plan  Patient appointment for 07/03/19 move to 10.8/20 with Dr. Wyonia Hough    Patient states he would like to change PCP. Patient advise he can change his PCP at his will

## 2019-06-24 NOTE — Telephone Encounter (Signed)
-----   Message from [place] sent at 06/24/2019 12:43 PM EDT -----  Jonathan [name] [id number], 48 year old, male    Calls today:  Clinical Questions ([place])    Name of person calling patient  Specific nature of request patient would like to talk to a nurse on behalf tylenol .  Return phone number [phone]  Person calling on behalf of patient: Patient (self)      Patient's language of care: [place]    Patient needs a Administrator, sports.    Patient's PCP: Hale Bogus, MD

## 2019-06-26 ENCOUNTER — Ambulatory Visit (HOSPITAL_BASED_OUTPATIENT_CLINIC_OR_DEPARTMENT_OTHER): Payer: No Typology Code available for payment source | Admitting: Family Medicine

## 2019-10-08 ENCOUNTER — Encounter (HOSPITAL_BASED_OUTPATIENT_CLINIC_OR_DEPARTMENT_OTHER): Payer: Self-pay

## 2019-10-08 ENCOUNTER — Telehealth (HOSPITAL_BASED_OUTPATIENT_CLINIC_OR_DEPARTMENT_OTHER): Payer: Self-pay

## 2019-10-08 ENCOUNTER — Other Ambulatory Visit (HOSPITAL_BASED_OUTPATIENT_CLINIC_OR_DEPARTMENT_OTHER): Payer: Self-pay | Admitting: Family Medicine

## 2019-10-08 DIAGNOSIS — Z0100 Encounter for examination of eyes and vision without abnormal findings: Secondary | ICD-10-CM

## 2019-10-08 NOTE — Telephone Encounter (Signed)
-----   Message from Wende Mott sent at 10/08/2019 11:37 AM EST -----  Regarding: Requesting Results XR Left Knee  Jonathan [name] [id number], 49 year old, male    Calls today:  Test Results  Test Results Request from Patient    What test result(s) is the patient requesting? XR Knee Left  Date testing was done 06/19/2019  Who ordered the test(s) [facility]  Message routed to [facility] RNs  Mychart status reviewed Yes  Patient offered mychart Yes  Mychart activated Yes    Person calling on behalf of patient: Patient (self)    CALL BACK NUMBER: [phone]  Best time to call back:   Cell phone:   Other phone:    Patient's language of care: [place] ([place])    Patient needs a [place] interpreter.    Patient's PCP: Lawana Chambers, MD

## 2019-10-08 NOTE — Telephone Encounter (Signed)
Called pt with the assistance of the portuguese interpreter-Lucia  Message left on voicemail asking for a return call , will my chart message pt as well.  email done  Left knee xray from 06/19/2019  Impression:     Left knee soft tissue swelling with lateral patellar tilt. No acute fracture or dislocation.

## 2019-11-21 ENCOUNTER — Ambulatory Visit: Payer: No Typology Code available for payment source | Attending: Ophthalmology | Admitting: Ophthalmology

## 2019-11-21 ENCOUNTER — Other Ambulatory Visit: Payer: Self-pay

## 2019-11-21 ENCOUNTER — Encounter (HOSPITAL_BASED_OUTPATIENT_CLINIC_OR_DEPARTMENT_OTHER): Payer: Self-pay | Admitting: Ophthalmology

## 2019-11-21 DIAGNOSIS — H11002 Unspecified pterygium of left eye: Secondary | ICD-10-CM | POA: Insufficient documentation

## 2019-11-21 DIAGNOSIS — H5203 Hypermetropia, bilateral: Secondary | ICD-10-CM | POA: Diagnosis not present

## 2019-11-21 DIAGNOSIS — H52203 Unspecified astigmatism, bilateral: Secondary | ICD-10-CM | POA: Diagnosis present

## 2019-11-21 DIAGNOSIS — H524 Presbyopia: Secondary | ICD-10-CM | POA: Diagnosis present

## 2019-11-21 HISTORY — DX: Unspecified pterygium of left eye: H11.002

## 2019-11-21 HISTORY — DX: Presbyopia: H52.03

## 2019-11-21 NOTE — Progress Notes (Signed)
49 years old male,    Last seen in 2005.    C/O Blurry vision OU at distance and near. + Growth OS, nasally. + Redness. S/P Pterygium surgery in right eye (2004). No pain. No flashes or floaters.

## 2019-11-21 NOTE — Progress Notes (Signed)
Patient presents with:  Blurry VA: His vision is blurry, distance and near, and some discomfort wearing the glasses. For comprehensive eye exam.    Other: He is aware of "growth" on surface of left eye, gradually worse.He is s/p excision of pterygium OD by [facility]JP II (2005).  He has a pterygium OS, moderate, approaching the visual axis. R/b/a of excision with mitomycin C and amniograft OS discussed. He elects to proceed.    Hyperopia with astigmatism and presbyopia.     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 I reminded him of the importance of social distancing, to reduce the risk of serious infectino.

## 2019-11-28 ENCOUNTER — Other Ambulatory Visit: Payer: Self-pay

## 2019-12-05 ENCOUNTER — Telehealth (HOSPITAL_BASED_OUTPATIENT_CLINIC_OR_DEPARTMENT_OTHER): Payer: Self-pay | Admitting: Family Medicine

## 2019-12-05 NOTE — Telephone Encounter (Signed)
Nurse from [facility] called the Central Refill Department to complete a benefit analysis for the tdap Vaccine.    The vaccine is covered under the patients [employer] medical coverage.    Please choose Private

## 2019-12-08 ENCOUNTER — Telehealth (HOSPITAL_BASED_OUTPATIENT_CLINIC_OR_DEPARTMENT_OTHER): Payer: Self-pay | Admitting: Family Medicine

## 2019-12-08 ENCOUNTER — Other Ambulatory Visit: Payer: Self-pay

## 2019-12-08 ENCOUNTER — Ambulatory Visit: Payer: No Typology Code available for payment source | Attending: Family Medicine | Admitting: Physician Assistant

## 2019-12-08 ENCOUNTER — Encounter (HOSPITAL_BASED_OUTPATIENT_CLINIC_OR_DEPARTMENT_OTHER): Payer: Self-pay | Admitting: Physician Assistant

## 2019-12-08 VITALS — BP 110/69 | HR 84 | Temp 97.7°F | Ht 65.71 in | Wt 155.0 lb

## 2019-12-08 DIAGNOSIS — H9203 Otalgia, bilateral: Secondary | ICD-10-CM | POA: Diagnosis not present

## 2019-12-08 DIAGNOSIS — Z01818 Encounter for other preprocedural examination: Secondary | ICD-10-CM | POA: Diagnosis present

## 2019-12-08 NOTE — Progress Notes (Signed)
SUBJECTIVE:  Jonathan [name] is a 49 year old male who is here for a preoperative physical.  He has an upcoming ophthalmologic surgery for excision of pterygium.    Pre-op:   Surgery is scheduled for 12/17/19 w/ Dr. Virl Diamond   Reports sister will help him s/p surgery   Has had hx of ophthalmologic surgery in 2018 w/o complication    No hx of excessive/abnormal bleeding    Level of activity - (ADLs w/o assist, walk indoors around the house, do light house work, climb a flight of stairs, run a short distance, participate in strenuous sports): good, able to walk/lift weights w/o concern; also bicycles most days   Medications reviewed and up to date    Never smoker    Ear concern:   Reports change in hearing over the last 2-3 years   Notes intermittent, pain around/in the ears for 2-3 years, will last a few hours and then subside   Finds that he finds the hearing to be worse on the left than right   No other symptoms associated, strongly desires to meet with ENT    Allergy History:  Patient has no known allergies.    Current Outpatient Medications   Medication Sig    ibuprofen (ADVIL) 200 MG tablet Take 1 tablet by mouth nightly as needed for Pain     No current facility-administered medications for this visit.       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  11/21/2019: Pterygium of left eye    Past Surgical History:  11/2003: EXCISION/TRANSPOSITION PTERYGIUM W/O GRAFT      Comment:  s/p excision of pterygium OD by VJP II    Review of patient's family history indicates:  Problem: Stroke      Relation: Father          Age of Onset: (Not Specified)          Comment: 40  Problem: GI      Relation: Mother          Age of Onset: (Not Specified)          Comment: died 80 after ?GI surgery  Problem: Blood Disease      Relation: Brother          Age of Onset: (Not Specified)          Comment: not sure what        REVIEW OF SYSTEMS:    Skin: negative   Eyes: negative   Ears/Nose/Throat: negative   Respiratory: negative   Cardiovascular: negative   Gastrointestinal: negative   Genitourinary: negative   Musculoskeletal: negative   Neurologic: negative   Psychiatric: negative   Hematologic/Lymphatic/Immunologic: negative   Endocrine: negative     BP 110/69    Pulse 84    Temp 97.7 F (36.5 C) (Temporal)    Ht 5' 5.71" (1.669 m)    Wt 70.3 kg (155 lb)    SpO2 98%    BMI 25.24 kg/m   Pain Score: Data Unavailable      OBJECTIVE:  General appearance: healthy, alert, well developed, well nourished  Head: Normocephalic. No masses, lesions, tenderness or abnormalities  Eyes: corneas clear, lids and lashes normal and pupils equal, round, reactive to light and accomodation  Ears: External ears normal. Canals clear. TM's normal. No tenderness appreciated on external exam   Nose/Sinuses: negative, deferred due to COVID era  Oropharynx: Lips, mucosa, and tongue normal. Teeth and gums normal. Oropharynx moist and without lesion  Neck: Neck supple. No adenopathy. Thyroid symmetric, normal size, and without nodularity  Lungs: Percussion normal. Good diaphragmatic excursion. Lungs clear to auscultation bilaterally  Heart: PMI normal. No lifts, heaves, or thrills. RRR. No murmurs, clicks, gallops or rubs  Abdomen: Abdomen soft, non-tender. BS normal. No masses, no organomegaly  Neuro: Gait normal. Reflexes normal and symmetric. Sensation grossly normal       ASSESMENT and PLAN:  (Z01.818) Preoperative examination  (primary encounter diagnosis)  Comment/Plan: As above. At this time, patient is low risk for adverse cardiac/pulmonary events. He can can proceed to surgery without further testing.     (H92.03) Discomfort of both ears  Comment: As above. Unremarkable exam. Etiology is unclear, he is currently without symptoms. Will place referral per patient preference.   Plan: REFERRAL TO ENT ( INT)      1. The patient indicates understanding of these issues and  agrees with the plan.  2.  The patient is given an After Visit Summary sheet that lists all of their medications with directions, their allergies, orders placed during this encounter, immunization dates, and follow- up instructions.  3. I reviewed the patient's medical information and medical history.  4.  I reconciled the patient's medication list and prepared and supplied needed refills.  5.  I have reviewed the past medical, family, and social history sections including the medications and allergies listed in the above medical record.    Briscoe Deutscher, PA-C, 12/08/2019

## 2019-12-08 NOTE — Telephone Encounter (Signed)
benefit analysis for the tdap Vaccine.  The vaccine is covered under the patient’s [employer] medical coverage.     Please choose Private

## 2019-12-09 ENCOUNTER — Encounter (HOSPITAL_BASED_OUTPATIENT_CLINIC_OR_DEPARTMENT_OTHER): Payer: Self-pay | Admitting: Ophthalmology

## 2019-12-10 NOTE — Discharge Instructions (Signed)
INSTRUES DO PR-OPERATRIO PARA O SURGICAL DAY CARE   SURGICAL DAY CARE PRE-OPERATIVE INSTRUCTIONS    Antes da sua cirurgia  Before your surgery    Fazendo seu teste para COVID-19    Voc precisa fazer um teste para COVID-19 antes da sua cirurgia. Ele ser realizado no nosso local de testes em [place].  You need to get tested for COVID before your surgery. This will be at our testing site at [address] in [place].     3/29 at 10:05 am    Por favor, pratique o distanciamento social aps o seu teste para COVID-19. Voc precisa permanecer isolado o mximo possvel at a [place] Por favor, informe ao seu cirurgio se:  You need to get tested for COVID before your surgery. This will be at our testing site on [address] in [place].      voc tiver febre ou outros sintomas de doena         you get a fever or other symptoms of illness   voc, ou algum com quem voc mora, tiver contato com uma pessoa com  COVID-19 ou uma pessoa doente         you, or someone you live with, is exposed to a person with COVID or someone who is sick         No dia da sua cirurgia     Pea para algum deix-lo no hospital no horrio [facility]. Por favor, lembre-se de Marine scientist. Quando chegar aqui, dirija-se  rea de registro do hospital.   You should have someone drop you off at the hospital at the right time. Please remember to wear a face mask.  When you get here, please go to the registration area of the hospital.       [facility] na                          Wednesday,                     March  31 s (Time):                  12:00 pm.  Arrive at:  Registration on (Day of the Week, Month, Day, at (Time)    Atualmente, os visitantes no podem esperar voc no hospital durante a [place]. Essa medida visa proteger todos os nossos pacientes. Por favor, fale conosco caso voc seja pai ou me acompanhando um paciente menor de 18 anos ou  um(a) cuidador(a) acompanhando um paciente com deficincia.  Currently, visitors cannot wait for you in the hospital during surgery. This is to protect all our patients. Please talk to [place] if you are a parent with a patient under 18 or a caregiver for a disabled patient.      Voc no poder dirigir aps a cirurgia.  imprescindvel que um adulto busque voc e o leve para casa. Seu acompanhante dever busc-lo em frente ao hospital.  You can not drive after surgery. You must have an adult pick you up and bring you home. Your ride should plan to pick you up outside the hospital.     Ligaremos para o seu acompanhante para avis-lo quando voc pode ir embora e onde se encontrar com voc. Ele ou ela dever ligar para a sala de recuperao (voc receber o nmero) Technical brewer. Voc ser levado ao encontro dele.  We will call your ride to let them know when you can leave and where to meet you. They should call the recovery room (we will give you the number) when they arrive. We will bring you out to meet them.    Recomendamos ter algum em casa aps a sua cirurgia para ajud-lo.   We suggest you plan to have someone help you at home after your surgery.        INSTRUES:   INSTRUCTIONS:   Voc no poder comer ou beber nada aps a meia-noite da noite anterior  cirurgia, nem mesmo gua, goma de mascar ou balas.   You may have nothing to eat or drink after midnight the night before your surgery, not even water, gum or hard candy.     Voc no poder fumar na manh do dia da sua cirurgia.  You may not smoke the morning of your surgery.     Por favor, deixe em casa todos os objetos de valor, incluindo joias, relgios, dinheiro, etc.  Please leave all valuables at home, including jewelry, watches, money, etc.     Remova qualquer esmalte das unhas antes de chegar ao Surgical Day Care e no use qualquer maquiagem ou batom.  Please remove all fingernail polish before arriving at Surgical Day Care and do not  wear any face  or lip make-up.     Se for fazer cirurgia ocular, no aplique maquiagem nos olhos ou no rosto e evite hidratantes faciais e perfumes.  If you are having eye surgery, do not wear any eye or face makeup and avoid facial moisturizers and perfumes.     Por favor, retirequaisquer extenses de cabelo que possam ser [employer].  Please take outany hair extensions that can be removed.     No raspe os pelos da rea da cirurgia.  Do not shave surgical site.     No use lentes de contato.  Do not wear contact lenses.    MEDICAMENTOS:   MEDICATIONS:     Tome a medicao a seguir na noite anterior  cirurgia, na hora de dormir:      none   Take the following medication the night before surgery at bedtime:     [place] a seguir na manh do dia da sua cirurgia, com apenas um gole de gua:       none  Take the following medication the morning of your surgery with only a sip of water:

## 2019-12-11 ENCOUNTER — Ambulatory Visit
Admission: RE | Admit: 2019-12-11 | Discharge: 2019-12-11 | Disposition: A | Discharge status: Home / Self Care | Payer: No Typology Code available for payment source

## 2019-12-15 ENCOUNTER — Ambulatory Visit
Admission: RE | Admit: 2019-12-15 | Discharge: 2019-12-15 | Disposition: A | Discharge status: Home / Self Care | Payer: No Typology Code available for payment source | Attending: Family Medicine | Admitting: Family Medicine

## 2019-12-15 DIAGNOSIS — Z03818 Encounter for observation for suspected exposure to other biological agents ruled out: Secondary | ICD-10-CM | POA: Insufficient documentation

## 2019-12-15 DIAGNOSIS — Z1152 Encounter for screening for COVID-19: Secondary | ICD-10-CM | POA: Diagnosis present

## 2019-12-15 LAB — COVID-19 OUTPATIENT: COVID-19 OUTPATIENT: NEGATIVE

## 2019-12-16 ENCOUNTER — Encounter (HOSPITAL_BASED_OUTPATIENT_CLINIC_OR_DEPARTMENT_OTHER): Payer: Self-pay | Admitting: Family Medicine

## 2019-12-16 NOTE — Anesthesia Preprocedure Evaluation (Addendum)
Pre-Anesthetic Note  .  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  ibuprofen (ADVIL) 200 MG tablet, Take 1 tablet by mouth nightly as needed for Pain, Disp: , Rfl:         Home Medications  No medications prior to admission.      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    Review of Systems     Patient summary reviewed      Anesthetic History:   negative anesthesia history ROS           Cardiovascular: Positive for hypercholesterolemia.   Physical Activity in METs greater than 4    Pulmonary: Negative.    GU/Renal: Negative.    Hepatic: Negative.    Neurological: Positive for headaches.   Gastrointestinal: Positive for constipation and GERD.   Hematological: Negative.    Endocrine: Negative.    Eyes:  Positive for visual disturbance (Hyperopia, astigmatism and presbyopia).   Nose:  Positive for congestion (Allergic rhinitis ).    Musculoskeletal: Positive for back pain (Sciatica).   Psychiatric: Negative.    Skin: Negative.        Physical Exam    General     Level of consciousness:  Alert   Airway     Mallampati:  II    TM distance:  >3 FB    Mouth opening:  >3 FB    Neck ROM:  Full   Teeth  - normal exam  }   Heart   - normal exam     Lungs - normal exam               Pertinent Labs:   Lab Results   Component Value Date    NA 140 12/21/2015    K 4.1 12/21/2015    CREAT 0.7 12/21/2015    GLUCOSER 86 12/21/2015    WBC 6.4 12/21/2015    HCT 44.6 12/21/2015    PLTA 226 12/21/2015     EKG REPORT  Test Reason :    Blood Pressure :  mmHG    Vent. Rate : 068 BPM   Atrial Rate : 068 BPM    P-R Int : 136 ms     QRS Dur : 074 ms    QT Int : 346 ms    P-R-T Axes : 057 002 006 degrees    QTc Int : 367 ms      Normal sinus rhythm    Normal ECG    When compared with ECG of 22-Nov-2013 19:14,    No significant change was found      Referred By: Barrett Shell      Confirmed YF:[id number] Budiu      ECHO CONCLUSIONS    -----------    1. LV ejection fraction is 60%.    2. There are no regional wall motion abnormalities.    3. Otherwise normal study.    4. Click the 'View Image' link below to view the full report.    Electronically signed off NB:VAPOLI Lavada Mesi, MD      _________________________________    Electronically Signed by: Teodoro Kil MD    Signed on: 05/04/15   Anesthesia Plan    ASA Score:     ASA:  2    Airway:      Mallampati:  II    Mouth opening:  >3 FB    Neck ROM:  Full    TM distance:  >3 FB    Plan: MAC    Other information:     EKG Reviewed: : Yes      Full Stomach Precaution:: No      Post-Plan::  PACU    Anesthesia Assessment and Plan:        MAC    Informed Consent:     Anesthetic plan and risks discussed with:  Patient   Patient Consented        Attending Anesthesiologist Statement:     Reassessed day of surgery: Yes        Assessment made, necessary equipment and appropriate plan in  place.

## 2019-12-17 ENCOUNTER — Encounter (HOSPITAL_BASED_OUTPATIENT_CLINIC_OR_DEPARTMENT_OTHER)
Admission: RE | Disposition: A | Discharge status: Home / Self Care | Payer: Self-pay | Source: Ambulatory Visit | Attending: Ophthalmology

## 2019-12-17 ENCOUNTER — Ambulatory Visit
Admission: RE | Admit: 2019-12-17 | Discharge: 2019-12-17 | Disposition: A | Discharge status: Home / Self Care | Payer: No Typology Code available for payment source | Attending: Ophthalmology | Admitting: Ophthalmology

## 2019-12-17 ENCOUNTER — Encounter (HOSPITAL_BASED_OUTPATIENT_CLINIC_OR_DEPARTMENT_OTHER): Payer: Self-pay | Admitting: Ophthalmology

## 2019-12-17 ENCOUNTER — Ambulatory Visit (HOSPITAL_BASED_OUTPATIENT_CLINIC_OR_DEPARTMENT_OTHER): Payer: No Typology Code available for payment source | Admitting: Anesthesiology

## 2019-12-17 DIAGNOSIS — H11002 Unspecified pterygium of left eye: Secondary | ICD-10-CM | POA: Diagnosis not present

## 2019-12-17 HISTORY — PX: PTERYGIUM EXCISION W/ GRAFT: SHX2273

## 2019-12-17 SURGERY — EXCISION, PTERYGIUM, WITH AMNIOTIC MEMBRANE GRAFT APPLICATION
Anesthesia: Monitor Anesthesia Care | Site: Eye | Laterality: Left | Wound class: Class I/ Clean

## 2019-12-17 MED ORDER — MOXIFLOXACIN HCL 0.5 % OP SOLN
1.0000 [drp] | Freq: Four times a day (QID) | OPHTHALMIC | Status: DC
Start: 2019-12-17 — End: 2019-12-17

## 2019-12-17 MED ORDER — TETRACAINE HCL 0.5 % OP SOLN
1.00 [drp] | Freq: Once | OPHTHALMIC | Status: AC
Start: 2019-12-17 — End: 2019-12-17
  Administered 2019-12-17: 1 via OPHTHALMIC

## 2019-12-17 MED ORDER — MOXIFLOXACIN HCL 0.5 % OP SOLN
1.0000 [drp] | OPHTHALMIC | Status: AC
Start: 2019-12-17 — End: 2019-12-17
  Administered 2019-12-17 (×3): 1 [drp] via OPHTHALMIC

## 2019-12-17 MED ORDER — PREDNISOLONE ACETATE 1 % OP SUSP
1.0000 [drp] | Freq: Four times a day (QID) | OPHTHALMIC | Status: DC
Start: 2019-12-17 — End: 2019-12-17

## 2019-12-17 MED ORDER — BUPIVACAINE HCL (PF) 0.75 % IJ SOLN
INTRAMUSCULAR | Status: AC
Start: 2019-12-17 — End: 2019-12-17
  Administered 2019-12-17: 3.5 mL via OPHTHALMIC
  Filled 2019-12-17: qty 10

## 2019-12-17 MED ORDER — TETRACAINE HCL 0.5 % OP SOLN
OPHTHALMIC | Status: AC
Start: 2019-12-17 — End: 2019-12-17
  Administered 2019-12-17: 1 via OPHTHALMIC
  Filled 2019-12-17: qty 4

## 2019-12-17 MED ORDER — ACETAMINOPHEN-CODEINE #3 300-30 MG PO TABS
1.00 | ORAL_TABLET | ORAL | 0 refills | Status: AC | PRN
Start: 2019-12-17 — End: 2019-12-24

## 2019-12-17 MED ORDER — BSS IO SOLN
INTRAOCULAR | Status: AC
Start: 2019-12-17 — End: 2019-12-17
  Administered 2019-12-17: 1
  Filled 2019-12-17: qty 15

## 2019-12-17 MED ORDER — REMIFENTANIL 20MCG/ML IV SYRINGE
Freq: Once | INTRAMUSCULAR | Status: DC | PRN
Start: 2019-12-17 — End: 2019-12-17
  Administered 2019-12-17: 10 ug via INTRAVENOUS
  Administered 2019-12-17: 50 ug via INTRAVENOUS

## 2019-12-17 MED ORDER — BUPIVACAINE HCL (PF) 0.75 % IJ SOLN
5.0000 mL | Freq: Once | INTRAMUSCULAR | Status: DC
Start: 2019-12-17 — End: 2019-12-17

## 2019-12-17 MED ORDER — ACETAMINOPHEN 325 MG PO TABS
325.00 mg | ORAL_TABLET | Freq: Four times a day (QID) | ORAL | 0 refills | Status: AC | PRN
Start: 2019-12-17 — End: 2020-01-16

## 2019-12-17 MED ORDER — LIDOCAINE-EPINEPHRINE 1 %-1:200000 IJ SOLN
5.0000 mL | Freq: Once | INTRAMUSCULAR | Status: DC
Start: 2019-12-17 — End: 2019-12-17

## 2019-12-17 MED ORDER — REMIFENTANIL 20MCG/ML IV SYRINGE
INTRAMUSCULAR | Status: AC
Start: 2019-12-17 — End: 2019-12-17
  Filled 2019-12-17: qty 100

## 2019-12-17 MED ORDER — ERYTHROMYCIN 5 MG/GM OP OINT
0.5000 [in_us] | TOPICAL_OINTMENT | Freq: Every evening | OPHTHALMIC | Status: DC
Start: 2019-12-17 — End: 2019-12-17

## 2019-12-17 MED ORDER — LIDOCAINE-EPINEPHRINE 2 %-1:200000 IJ SOLN
INTRAMUSCULAR | Status: AC
Start: 2019-12-17 — End: 2019-12-17
  Administered 2019-12-17: 3.5 mL via SUBCUTANEOUS
  Filled 2019-12-17: qty 20

## 2019-12-17 MED ORDER — MITOMYCIN 0.2 MG OP KIT
0.20 mg | PACK | Freq: Once | OPHTHALMIC | Status: AC
Start: 2019-12-17 — End: 2019-12-17
  Administered 2019-12-17: 0.2 mg via OPHTHALMIC
  Filled 2019-12-17: qty 1

## 2019-12-17 MED FILL — ACETAMIN 325MG: 3 days supply | Qty: 15 | Fill #0 | Status: CP

## 2019-12-17 MED FILL — APAP/CODEINE 300-30MG: 7 days supply | Qty: 15 | Fill #0

## 2019-12-17 SURGICAL SUPPLY — 15 items
.9% NACL IRRIGATION 500ML (SOLUTION) ×3 IMPLANT
AMNIO GRAFT 2.X1.5 ×3 IMPLANT
BEAVER GUARDED DEPTH BLADE (OPHSURG) ×3 IMPLANT
BI-POLAR CABLE (CAUTERY) ×3 IMPLANT
DRESSING SPONGE 4X4 (DRESSING) ×6 IMPLANT
FINE TIP STERILE SKIN MARKERS (SURGMARK) ×3 IMPLANT
PACK EYE (PACK) ×3 IMPLANT
POLYSORB SUTURE 8-0 SE1408 (SUTURE) ×3 IMPLANT
STERILE WATER IRR. 500ML (SOLUTION) ×3 IMPLANT
SURGEON GLOVE LF/PF 8 STER (GLOVE) ×3 IMPLANT
SURGEON GLOVE LF/PF 8.5 STER (GLOVE) ×3 IMPLANT
SURGICAL EYE SPEARS (OPHSURG) ×9 IMPLANT
TEGADERM DRESSING 2 3/8 X 2.75 (DRESSING) ×3 IMPLANT
TLC ARM FOAM CRADLE POSITIONER (POSITION) IMPLANT
WET-FIELD ERASER 18GA 45 DEG (SURGPRB) ×3 IMPLANT

## 2019-12-17 NOTE — Anesthesia Postprocedure Evaluation (Signed)
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])    Surgeons: Erline Levine II, MD Responsible Provider: Bertrum Sol, MD    Anesthesia Type: MAC ASA Status: 2            POST-OPERATIVE EVALUATION    Anesthesia Post Evaluation    Vitals signs in patient's normal range: Yes  Respiratory function stable; airway patent: Yes  Cardiovascular function stable: Yes  Hydration status stable: Yes  Mental status recovered; patient participates in evaluation and/or is at baseline: Yes  Pain control satisfactory: Yes  Nausea and vomiting control satisfactory: Yes    Procedure was labor & delivery no  PostOP disposition PACU  Anesthesia Observation no significant observation      Last vitals  BP   (!) 147/103 (12/17/19 1401)    Temp   98 F (36.7 C) (12/17/19 1401)    Pulse   74 (12/17/19 1401)   Resp   17 (12/17/19 1401)    SpO2   98 % (12/17/19 1401)

## 2019-12-17 NOTE — Discharge Instructions (Signed)
Please keep the operative eye covered with the eye patch and the eye-shield until you  Are seen in my office by me tomorrow. Call my office [phone] for moderate or severe eye pain.    There is an answering service 24 hours a day, and either I, or the ophthalmologist on call for my practice, will call you back.  You should resume all or you pre-operative medications when you get home. You may take Tylenol with codeine, every 4 hours for mild pain.  Don't drive or operate heavy machinery today.    Por favor, mantenha o olho operativo coberto com o tapa-olho e o protetor de olho at [employer] seja visto por mim em meu escritrio [employer]. Sheryle Hail para meu escritrio [phone] para dor moderada ou intensa nos olhos. H um servio de atendimento 24 horas por dia, e eu ou o oftalmologista de planto para o meu consultrio ligo de [place].  Voc deve retomar todos os medicamentos pr-operatrios ou todos quando Financial trader. Voc pode tomar Tylenol com codena, a cada 4 horas para dores leves.  No dirija ou opere mquinas pesadas hoje.

## 2019-12-17 NOTE — Op Note (Signed)
PREOPERATIVE DIAGNOSIS:  Pterygium, left eye.    POSTOPERATIVE DIAGNOSIS:  Pterygium, left eye.    SURGEON:  Melburn Popper II, MD.    Assistant: none    Date of Procedure: 12/17/19    PROCEDURE:  Pterygium excision with AmnioDry (amniotic membrane) graft of the left eye.     PROCEDURE DESCRIPTION:  The patient was brought operating room where a peri-bulbar injection of the left eye was performed by Dr. Nathaneil Canary using 5 mL of 2% lidocaine containing epinephrine mixed with 5 mL of 0.75% Marcaine.  Approximately 4 mL were used for peribulbar injection.  This produced good akinesia and anesthesia.  Intravenous sedation and monitoring was performed by the anesthesiologist at all times.  The operating microscope was used for good surgical control.  The body of the pterygium of the left eye was then marked using a sterile inking marking pen.   The head of the pterygium was then excised from the cornea in a partial thickness lamella keratectomy fashion.  The body of the pterygium was excised using Westcott scissors.  Cautery was performed to the bare sclera for hemostasis.  After excision of the body of the pterygium and cautery of the scleral bed, mitomycin C was applied to the scleral bed; 0.2 mg/mL concentration was used.  Two drops of the mitomycin were placed on a small piece of Weck-cel sponge.  This was applied to the scleral bed for 2.5 minutes and then removed.  The area was copiously irrigated.  This was done using balanced salt solution.  Attention was then given to suturing the amniotic membrane graft in place.  The Amniograft (amniotic membrane graft) was cut after removal from the packag and placed over the conjunctiva defect covering the bare sclera.  This was then well hydrated with BSS.  The graft was then sutured in place using eight, 8-0 Vicryl sutures in an interrupted fashion.  At the end of the case, there was excellent hemostasis.  The Amniotic membrane graft was in good position filling the defect  over the sclera without tension.  There was excellent hemostasis.  A drop of Vigamox and then a ribbon of Ilotycin ointment were placed on the surface of the globe.  Two sterile pressure patches were placed on top of the closed eyelid and a shield on top of the patch.  The patient went to the recovery room doing well.

## 2019-12-17 NOTE — H&P (Signed)
Patient Assessment Update: (Fill out Prior to procedure or within 24 hours of  admission if H&P done pre-admission.)   Re-evaluation including history review and physical examination has been performed.    Patient's Condition No Change    Evangeline Gula II, MD, 12/08/19     I reviewed the in person H&P done by Darlina Guys, PA-C 12/09/19     12/17/19  1136   BP: (!) 131/96   Pulse: 92   Resp: 16   Temp: 98.6 F (37 C)   TempSrc: Temporal   SpO2: 97%   Weight: 68.9 kg (152 lb)   Height: 5\' 5"  (1.651 m)

## 2019-12-18 ENCOUNTER — Encounter (HOSPITAL_BASED_OUTPATIENT_CLINIC_OR_DEPARTMENT_OTHER): Payer: Self-pay | Admitting: Ophthalmology

## 2019-12-18 ENCOUNTER — Ambulatory Visit: Payer: No Typology Code available for payment source | Attending: Family Medicine | Admitting: Ophthalmology

## 2019-12-18 DIAGNOSIS — H11002 Unspecified pterygium of left eye: Secondary | ICD-10-CM | POA: Diagnosis not present

## 2019-12-18 MED ORDER — PREDNISOLONE ACETATE 1 % OP SUSP
1.0000 [drp] | Freq: Four times a day (QID) | OPHTHALMIC | 4 refills | Status: DC
Start: 2019-12-18 — End: 2020-02-19

## 2019-12-18 MED ORDER — PREDNISOLONE ACETATE 1 % OP SUSP
1.0000 [drp] | Freq: Four times a day (QID) | OPHTHALMIC | 4 refills | Status: DC
Start: 2019-12-18 — End: 2019-12-18

## 2019-12-18 MED ORDER — CIPROFLOXACIN HCL 0.3 % OP SOLN
1.00 [drp] | Freq: Four times a day (QID) | OPHTHALMIC | 1 refills | Status: AC
Start: 2019-12-18 — End: 2020-01-08

## 2019-12-18 MED ORDER — CIPROFLOXACIN HCL 0.3 % OP SOLN
1.0000 [drp] | Freq: Four times a day (QID) | OPHTHALMIC | 1 refills | Status: DC
Start: 2019-12-18 — End: 2019-12-18

## 2019-12-18 MED FILL — CIPROFLOXACN SOL 0.3% OP: 21 days supply | Qty: 10 | Fill #0

## 2019-12-18 MED FILL — PREDNISOLONE SUS 1% OP: 67 days supply | Qty: 15 | Fill #0

## 2019-12-18 NOTE — Progress Notes (Signed)
Patient presents with:  Post-Operative Ophthalmic Surgery: one day (12/17/19) s/p pterygium excision, with mitomycin C and amniograft OS, doing well, somewhat scratchy, for routine post operative care. The patient is doing well one day post-operative s/p pterygium excision with membrane graft of the left eye.  He is instructed to pay careful attention not to rub his eye.    He will use the eye shield whenever he sleeps to protect the operative eye.    He will use the following medications:  Vigamox, one drop, to left eye four times a day for a week.  Econopred plus (1%), to left eye, four times a day for a week.  He will follow up in one week, or sooner if he has eye pain, increased redness, or decreased vision.      Other: s/p remote excision of pteryium OD, doing okay.

## 2019-12-18 NOTE — Progress Notes (Signed)
Pt here for 1 day po s/p pterygium excision w/amnioitc membrane graft and mitomycin C OS      Pt is having pain (5) and scratchy feeling especially when opening and closing his eye.

## 2019-12-19 LAB — SURGICAL PATH SPECIMEN EYE

## 2019-12-25 ENCOUNTER — Other Ambulatory Visit: Payer: Self-pay

## 2019-12-25 ENCOUNTER — Ambulatory Visit: Payer: No Typology Code available for payment source | Attending: Family Medicine | Admitting: Optometrist

## 2019-12-25 DIAGNOSIS — Z9889 Other specified postprocedural states: Secondary | ICD-10-CM

## 2019-12-25 NOTE — H&P (Signed)
Patient presents for one week s/p pterygium excision with mitomycin C and amniograft OS on 12/17/19 with Dr. Maia Petties II. Patient is doing well with mild soreness and redness OS, no eye pain, discharge. Patient reports taking eye drops as instructed, Vigamox QID OS and Pred Forte QID OS, erythromycin ung BID-TID OS    POHx:  H/o pterygium excision OD

## 2019-12-25 NOTE — Progress Notes (Signed)
I had a pleasure of seeing Jonathan [name] at the [facility] on 12/25/19. The summary of findings of the PO W1 OS examination is as follows:     Assessment:     1. s/p pterygium excision with mitomycin C and amniograft OS on 12/17/19 with Dr. Maia Petties II        Plan:    1. Patient educated on findings, advised to discontinue use of vigamox and continue using Pred forte TID OS x 1 week, BID OS x 1 week, QD OS x 1 week, stopping drop entirely as of 01/15/2020. The patient can continue use of erythromycin ung QID OS as needed until tube is finished. The patient can continue using tylenol as needed for ocular pain as indicated by Dr. Maia Petties II.  Continue use of eye shield at night and avoid rubbing the operated eye. Patient edu on findings and advised on RD/endophthalmitis precautions. Patient should call immediately for any eye pain, increased redness or decreased vision. Otherwise should be seen back in 3-4 weeks for refraction and dilated fundus exam.       RTC as scheduled for POM1

## 2019-12-25 NOTE — Patient Instructions (Signed)
Pare de usar o Vigamox por enquanto.     Inicie a reduo gradual da dose do acetato de prednisolona (Pred Forte) da seguinte maneira:   1 gota no olho esquerdo 3 vezes ao dia, por 1 semana  1 gota no olho esquerdo 2 vezes ao dia, por 1 semana   1 gota no olho esquerdo 1 vez ao dia, por 1 semana   Pare a aplicao no 01/15/2020    Ligue imediatamente [phone]) se sentir qualquer dor nos olhos, aumento da vermelhido ou reduo da viso.     Retorne  clnica daqui a aproximadamente um ms, quando dilataremos os seus olhos e lhe daremos a prescrio para os seus culos novos.

## 2020-01-07 ENCOUNTER — Encounter (HOSPITAL_BASED_OUTPATIENT_CLINIC_OR_DEPARTMENT_OTHER): Payer: Self-pay | Admitting: Ophthalmology

## 2020-01-07 ENCOUNTER — Other Ambulatory Visit: Payer: Self-pay

## 2020-01-07 ENCOUNTER — Ambulatory Visit: Payer: No Typology Code available for payment source | Attending: Family Medicine | Admitting: Ophthalmology

## 2020-01-07 DIAGNOSIS — H11002 Unspecified pterygium of left eye: Secondary | ICD-10-CM | POA: Diagnosis not present

## 2020-01-07 NOTE — Progress Notes (Signed)
F/u to evaluate "brown spot" at site of pterygium surgery 3 weeks ago     Impression,    1. S/p pterygium surgery with amniotic membrane graft and mitomycin C OS 12/17/19 by VJP    It appeared that the graft may have sloughed off as he showed a photo from 24 hours ago with a brown possibly wadded up material in bed of pterygium that was no longer present.    There was staining over the bed of the pterygium.    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    Plan-D/C Pred forte  Use Ilotycin ung qid.    F/u with VJP in 4-5 days.    He was instructed to RTC if eye becomes painful/

## 2020-01-07 NOTE — Progress Notes (Signed)
pt here for urgent care visit  3 weeks po pterygium excision OS, pt c/o  Increased pain and swelling os and he saw a dark spot on conjunctiva os for 2 days but today he doesn't see it.  Denies changes in va ou  Denies flashes or floaters ou  faithful with gtt's and ung os       Ocular HX-s/p pterygium excisionwith mitomycin C and amniograft OSon 3/31/21with Dr. Maia Petties II    LEE 12-25-19

## 2020-01-13 ENCOUNTER — Ambulatory Visit (HOSPITAL_BASED_OUTPATIENT_CLINIC_OR_DEPARTMENT_OTHER): Payer: No Typology Code available for payment source | Admitting: Ophthalmology

## 2020-01-13 ENCOUNTER — Other Ambulatory Visit: Payer: Self-pay

## 2020-01-13 ENCOUNTER — Emergency Department
Admission: AD | Admit: 2020-01-13 | Discharge: 2020-01-13 | Disposition: A | Discharge status: Home / Self Care | Payer: No Typology Code available for payment source | Source: Intra-hospital | Attending: Emergency Medicine | Admitting: Emergency Medicine

## 2020-01-13 ENCOUNTER — Encounter (HOSPITAL_BASED_OUTPATIENT_CLINIC_OR_DEPARTMENT_OTHER): Payer: Self-pay

## 2020-01-13 DIAGNOSIS — Z20822 Contact with and (suspected) exposure to covid-19: Secondary | ICD-10-CM | POA: Diagnosis not present

## 2020-01-13 DIAGNOSIS — R509 Fever, unspecified: Secondary | ICD-10-CM | POA: Diagnosis present

## 2020-01-13 DIAGNOSIS — R0989 Other specified symptoms and signs involving the circulatory and respiratory systems: Secondary | ICD-10-CM | POA: Diagnosis present

## 2020-01-13 NOTE — Progress Notes (Unsigned)
49 years old male with s/p Pterygium surgery with amniotic membrane graft and mitomycin OS 12/17/2019 (Dr. Maia Petties) presents for 5 days follow up.    Runny nose, Fever and headache every night since past Wednesday. Sent to the Urgent care.    C/O eye pain, headache and fever last night. Vision stable OU. No flashes or floaters.    Using IIotycin ung qid in left eye only.

## 2020-01-13 NOTE — Narrator Note (Signed)
Pt was examined by Dr. Bach.

## 2020-01-13 NOTE — UC Triage Notes (Signed)
Pt states he went to the [facility] fr a scheduled appt and he was sent here because he reported fevers every night since 4/19.  Pt is C/O pain and redness of left eye.  Pt had surgical procedure on that eye on 4/21. Pt has not measured his temperature.  He states his head gets hot every night so he takes Tylenol.

## 2020-01-13 NOTE — UC Provider Notes (Signed)
The patient was seen primarily by me. UC nursing record was reviewed. Select prior records as available electronically through the Epic record were reviewed.    History, physical exam and disposition were conducted with an official hospital [place] interpreter.    HPI:    Jonathan [name] is a 49 year old male patient who presents with subjective fever.  Patient reports he was at the ophthalmology clinic today for a postop appointment, and was told to come to the urgent care after he revealed he had a fever last night.  Patient underwent pterygium surgery with amniotic membrane graft on 12/17/2019.  Patient reports he had one episode last night, one episode last week of painful hot eye and subjective warmth over his forehead.  He does not have a thermometer and did not take his temperature, but presumed he had a fever and took Tylenol.  Patient had clinic follow-up today and was told to come to the urgent care when he screened positive for fever and runny nose.  Patient reports his symptoms are improved at this time, and improved shortly after applying his erythromycin ointment last night.  He denies any vision changes, flashers/floaters or pain with extraocular movements.    ROS: Pertinent positives were reviewed as per the HPI above. All other systems were reviewed and are negative.  Jonathan [name]  Language of care: [place] Derwood Kaplan)  MRN: [id number]  PCP: Hale Bogus, MD  Mode of arrival to UC: Walk-in.  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  11/21/2019: Pterygium of left eye  Patient Active Problem List:     Headache disorder     Other and unspecified hyperlipidemia     Allergic rhinitis, cause unspecified     Family history of stroke (cerebrovascular)     Sciatica     Esophageal reflux     Slow transit constipation     Hyperopia  of both eyes with astigmatism and presbyopia     Pterygium of left eye    Past Surgical History: Past Surgical History:  11/2003: EXCISION/TRANSPOSITION PTERYGIUM W/O GRAFT      Comment:  s/p excision of pterygium OD by VJP II  12/17/2019: PTERYGIUM EXCISION W/ GRAFT; N/A      Comment:  s/p pterygium excision, with mitomycin C and amniograft                OS  Social History:   Social History     Socioeconomic History    Marital status: Single     Spouse name: Not on file    Number of children: Not on file    Years of education: Not on file    Highest education level: Not on file   Occupational History    Occupation: cook     Employer: [name]     Comment: In [place] 2002; 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, family   Tobacco Use    Smoking status: Never Smoker    Smokeless tobacco: Never Used   Substance and Sexual Activity    Alcohol use: No    Drug use: No    Sexual activity: Never     Comment: not sexually active   Other Topics Concern    Not on file   Social History Narrative    Working: Biomedical scientist - cold station at [employer]  Single, no children        No smoking    ETOH - once a month    No drugs        Lives with brother and sister-in-law and 2 children   Social Determinants of Health  Financial Resource Strain:     Difficulty of Paying Living Expenses:   Food Insecurity:     Worried About Programme researcher, broadcasting/film/video in the Last Year:     Barista in the Last Year:   Transportation Needs:     Freight forwarder (Medical):     Lack of Transportation (Non-Medical):   Physical Activity:     Days of Exercise per Week:     Minutes of Exercise per Session:   Stress:     Feeling of Stress :   Social Connections:     Frequency of Communication with Friends and Family:     Frequency of Social Gatherings with Friends and Family:     Attends Religious Services:     Active Member of Clubs or Organizations:     Attends Banker Meetings:     Marital Status:   Intimate Partner  Violence:     Fear of Current or Ex-Partner:     Emotionally Abused:     Physically Abused:     Sexually Abused:    Allergies: Review of Patient's Allergies indicates:  No Known Allergies    Immunizations:   Immunization History   Administered Date(s) Administered    HEP B ADULT 3 DOSE 20 and > 05/28/2003, 07/01/2003, 12/10/2003    PPD 05/26/2003    Td 05/26/2003          Medications:  Prior to Admission Medications   Prescriptions Last Dose Informant Patient Reported? Taking?   acetaminophen (TYLENOL) 325 MG tablet   No No   Sig: Take 1-2 tablets by mouth every 6 (six) hours as needed for Pain   ibuprofen (ADVIL) 200 MG tablet   Yes No   Sig: Take 1 tablet by mouth nightly as needed for Pain   prednisoLONE acetate (PRED FORTE) 1 % ophthalmic suspension   No Yes   Sig: Place 1 drop into the left eye 4 (four) times daily      Facility-Administered Medications: None     Physical Exam (ED Bed 01/01-A):   Patient Vitals for the past 99 hrs:   BP Temp Pulse Resp SpO2 Weight   01/13/20 1551 -- -- -- -- -- 72 kg (158 lb 11.7 oz)   01/13/20 1538 (!) 151/93 98.1 F 67 20 98 % --     GENERAL:  Alert, No acute distress, non-toxic appearance.   SKIN:  Warm & Dry.   HEAD:  Normocephalic, atraumatic.  The left eye has some scleral surface irregularities, which is likely postop, no obvious edema.  No pain with extraocular movements.  PERRLA.  There is patchy areas of erythema over the conjunctive.   PULM:  Breathing comfortably, in no respiratory distress.   NEUROLOGIC:  Alert, speaking in clear sentences, and moving all extremities.     Medications Given in the UC:  Medications - No data to display Radiology Studies:  N/A   Lab Results (abnormal results only):  Labs Reviewed   COVID-19 OUTPATIENT    Other Results (e.g. ECG):  N/A     Urgent Care Course and Medical Decision-Making:  Jonathan [name] is a 49 year old male patient who presents with subjective fever.  On exam, patient is well-appearing and in no acute distress.   Ocular exam reveals no pain with extraocular movements, pupillary responses equal bilaterally.  No reported vision changes.  Symptoms at this time are improved.  He does have some irregularities of the scleral surface with patchy erythema which appears to be somewhat chronic/improved postop when compared with the patient's pictures immediately postop.  Do feel patient does require ophthalmology follow-up for a more thorough eye exam, although urgent, nonemergent follow-up appropriate.  He is otherwise well-appearing, and has no objective fever here.  Instructed patient to buy a thermometer and keep track of temperatures at home, use Tylenol and Motrin as needed.  He has no other focal symptoms to suggest obvious etiology of fever.  Plan for outpatient Covid test.    Patient/family educated on diagnosis(es); he states understanding and agrees with plan of care.  Reasons to return to the UC or ED were reviewed in detail. He agrees with this plan and disposition.    Condition on Discharge: Stable    Diagnosis/Diagnoses:  Subjective fever    Eyvonne Left, MD, MPH  Attending Physician  [facility] Department of Emergency Medicine    This Emergency Department patient encounter note was created using voice-recognition software and in real time during the ED visit.

## 2020-01-13 NOTE — Narrator Note (Signed)
COVID swab was collected,

## 2020-01-13 NOTE — Narrator Note (Signed)
Patient Disposition  Patient education for diagnosis, COVID educationand follow-up.  Patient left UC 4:45 PM.  Patient rep received written instructions.    Interpreter to provide instructions: Yes; Interpreter ID: [id number]    Patient belongings with patient: YES    Have all existing LDAs been addressed? N/A    Have all IV infusions been stopped? N/A    Destination: Home

## 2020-01-13 NOTE — Discharge Instructions (Signed)
Jonathan [name], you were seen in the urgent care today for fever and eye pain.  Your eye exam does show some expected postop swelling and redness, but no obvious infection.  You did not have any vision changes and your pupils were normal.  At this time, we feel that it is safe for you to follow-up with the ophthalmologist for a more thorough post-operative exam.  Because of your subjective fevers, with we will do a Covid test, please quarantine until we call you with results in the next 2-3 days.  We suggest you buy a thermometer so you may measure your temperature at home.  A fever is anything greater than 100.4 degrees.  You may take Tylenol and/or ibuprofen as needed for fever and pain.  If you develop any loss of vision, floaters in your vision or severe worsening eye pain, please go to the emergency department    [name], o senhor foi atendido hoje no pronto-socorro por causa de febre e dores nos olhos. Seu exame de vista mostra algum inchao e vermelhido ps-operatrios esperados, mas nenhuma infeco evidente. Voc no teve nenhuma alterao na viso e suas pupilas estavam normais. No momento, sentimos que  seguro para voc fazer um acompanhamento com o oftalmologista para um exame ps-operatrio mais completo. Por causa de suas febres subjetivas, faremos um teste Covid. Por favor, coloque em quarentena at [employer] com os resultados nos prximos 2-3 dias. Sugerimos que voc compre [employer] termmetro para medir sua Doctor, general practice. A febre  qualquer coisa acima de 100,4 graus. Voc pode tomar Tylenol e / ou ibuprofeno conforme necessrio para a febre e a dor. Se voc desenvolver qualquer perda de viso, moscas volantes ou piora severa da dor ocular, v ao pronto-socorro

## 2020-01-14 ENCOUNTER — Encounter (HOSPITAL_BASED_OUTPATIENT_CLINIC_OR_DEPARTMENT_OTHER): Payer: Self-pay | Admitting: Family Medicine

## 2020-01-14 LAB — COVID-19 OUTPATIENT: COVID-19 OUTPATIENT: NEGATIVE

## 2020-01-22 ENCOUNTER — Ambulatory Visit: Payer: No Typology Code available for payment source | Attending: Otolaryngology | Admitting: Otolaryngology

## 2020-01-22 ENCOUNTER — Ambulatory Visit
Payer: No Typology Code available for payment source | Attending: Audiologist-Hearing Aid Fitter | Admitting: Audiologist-Hearing Aid Fitter

## 2020-01-22 ENCOUNTER — Other Ambulatory Visit: Payer: Self-pay

## 2020-01-22 DIAGNOSIS — H9313 Tinnitus, bilateral: Secondary | ICD-10-CM | POA: Diagnosis present

## 2020-01-22 DIAGNOSIS — H9042 Sensorineural hearing loss, unilateral, left ear, with unrestricted hearing on the contralateral side: Secondary | ICD-10-CM | POA: Diagnosis not present

## 2020-01-22 DIAGNOSIS — H9203 Otalgia, bilateral: Secondary | ICD-10-CM | POA: Diagnosis not present

## 2020-01-22 NOTE — Progress Notes (Signed)
History:  Jonathan [name] is a 49 year old male who reports an intermittent otalgia and tinnitus bilaterally for few months. He also states that he cannot hear anything from his left ear and that this happened 20 years ago.    Impressions:  Tympanometry is consistent with normal TM mobility and normal middle ear pressure in both ears. 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 WRS Excellent in the right ear. A sensorineural asymmetry noted, poorer in the left ear.     Recommendation:  Re-evaluation upon Physician's request

## 2020-01-22 NOTE — Progress Notes (Signed)
HPI: 49 year old male Shemar Ledwith complaints of B otalgia and tinnitus lasting ~10 min twice over the past mo. Last spell was 2 wks ago. He has a long h/o left unilateral hearing loss after swimming when 49 yo. Hearing never recovered. Today there is No otorrhea, No otalgia, No vertigo.    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      Social and family history reviewed either in EPIC or with patient and are not contributory unless specifically noted in HPI.    Physical Exam  There were no vitals filed for this visit.    Constitutional: normal.  Facial lesions: No.  Ears: Right: Obstructing cerumen:No. EAC dry and is open. TM normal.    Left:  Obstructing cerumen:No. EAC dry and is open. TM normal.   Facial Nerve: normal One out of 6 House-Brackmann scale bilaterally.  Sinus: Pain No.  Neck: Soft. Trachea midline. No palpable masses.  Thyroid: Normal to palpation.  Lymph:  No palpable LN in the neck.  Salivary glands:  Normal size, non-tender.  Communication: Voice normal.    Eyes: EOMI. Nystagmus No  Respiratory drive spontaneous and comfortable.  Neuro: normal.  Mood/Psych: normal.    I independently interpreted the patient's audiogram with hearing/speech thresholds and tympanometry that were ordered by me today and agree with audiologist's findings and recommendation.    Right nml audio with left profound SNHL with no discrim. Tymp nml B.     MRI brain on 02/24/2008 and  06/09/2003 was/were interpreted independently. It showed no CPA mass.    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 Discussed the pathophysiology of TMJ arthralgia and arthritis. Recommend soft diet (diet sheet was offered to the patient), TMJ massage, warm compress, avoiding chewing gum, and Aleve or Ibuprofen PRN pain. RTC in 4 wks if sx does not improve. RTC in a yr for repeat audio.     This record was generated using voice recognition software. I proof-read and corrected any voice recognitions errors found. Please excuse any remaining voice recognition errors which were not detected.

## 2020-01-29 ENCOUNTER — Ambulatory Visit: Payer: No Typology Code available for payment source | Attending: Family Medicine | Admitting: Ophthalmology

## 2020-01-29 ENCOUNTER — Encounter (HOSPITAL_BASED_OUTPATIENT_CLINIC_OR_DEPARTMENT_OTHER): Payer: Self-pay | Admitting: Ophthalmology

## 2020-01-29 ENCOUNTER — Other Ambulatory Visit: Payer: Self-pay

## 2020-01-29 DIAGNOSIS — H524 Presbyopia: Secondary | ICD-10-CM | POA: Diagnosis not present

## 2020-01-29 DIAGNOSIS — H5203 Hypermetropia, bilateral: Secondary | ICD-10-CM

## 2020-01-29 DIAGNOSIS — H11002 Unspecified pterygium of left eye: Secondary | ICD-10-CM

## 2020-01-29 DIAGNOSIS — H52203 Unspecified astigmatism, bilateral: Secondary | ICD-10-CM

## 2020-01-29 NOTE — Progress Notes (Signed)
Patient presents with:  Other: 6 weeks (12/17/19) s/p pterygium excision, with mitomycin C and amniograft OS, doing well. For routine post operative care. The eye is a little red. He saw Dr. 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 Doing well. For routine post operative care  At this point, looks good. No indiction for any additional treatment.  Follow up PRN, or 6 months.    Astigmatism and presbyopia. H's given a prescription for glasses.    Other: s/p remote excision of pterygium OD (2005) doing well.  No signs of recurrence.  Avoid UV, use sunglasses.

## 2020-01-29 NOTE — Progress Notes (Signed)
Patient presents for post op examination. S/p pterygium surgery with amniotic membrane graft and mitomycin C OS 12/17/19 by VJP    Per last examination with SMP: It appeared that the graft may have sloughed off as he showed a photo from 24 hours ago with a brown possibly wadded up material in bed of pterygium that was no longer present at last f/u 01/07/20.    Patient is doing well with c/o redness nasal OS, no eye pain, discharge. Patient reports taking eye drops as instructed, erythromycin QID OS, LD 1.5 weeks ago. Patient was not using any additional drops

## 2020-02-03 ENCOUNTER — Encounter (HOSPITAL_BASED_OUTPATIENT_CLINIC_OR_DEPARTMENT_OTHER): Payer: Self-pay

## 2020-02-03 NOTE — Progress Notes (Signed)
Patient arrived to order glasses from [facility], but the member does not have a plan which qualifies to optical services, patient come back next week with the owe frame.

## 2020-02-10 ENCOUNTER — Ambulatory Visit: Payer: Self-pay | Attending: Ophthalmology

## 2020-02-10 DIAGNOSIS — H5213 Myopia, bilateral: Secondary | ICD-10-CM | POA: Insufficient documentation

## 2020-02-17 ENCOUNTER — Encounter (HOSPITAL_BASED_OUTPATIENT_CLINIC_OR_DEPARTMENT_OTHER): Payer: Self-pay | Admitting: Family Medicine

## 2020-02-17 ENCOUNTER — Encounter (HOSPITAL_BASED_OUTPATIENT_CLINIC_OR_DEPARTMENT_OTHER): Payer: Self-pay | Admitting: Ophthalmology

## 2020-02-17 ENCOUNTER — Encounter (HOSPITAL_BASED_OUTPATIENT_CLINIC_OR_DEPARTMENT_OTHER): Payer: Self-pay

## 2020-02-19 ENCOUNTER — Encounter (HOSPITAL_BASED_OUTPATIENT_CLINIC_OR_DEPARTMENT_OTHER): Payer: Self-pay | Admitting: Ophthalmology

## 2020-02-19 ENCOUNTER — Other Ambulatory Visit: Payer: Self-pay

## 2020-02-19 ENCOUNTER — Ambulatory Visit: Payer: No Typology Code available for payment source | Attending: Family Medicine | Admitting: Ophthalmology

## 2020-02-19 DIAGNOSIS — H11222 Conjunctival granuloma, left eye: Secondary | ICD-10-CM | POA: Diagnosis not present

## 2020-02-19 DIAGNOSIS — H11002 Unspecified pterygium of left eye: Secondary | ICD-10-CM | POA: Diagnosis not present

## 2020-02-19 MED ORDER — PREDNISOLONE ACETATE 1 % OP SUSP
1.00 [drp] | Freq: Four times a day (QID) | OPHTHALMIC | 6 refills | Status: AC
Start: 2020-02-19 — End: 2020-08-20

## 2020-02-19 MED FILL — PREDNISOLONE SUS 1% OP: 50 days supply | Qty: 10 | Fill #0 | Status: CP

## 2020-02-19 NOTE — Progress Notes (Signed)
Patient presents with:  Follow Up: Patient is 2.5 months S/p pterygium excision, with mitomycin C and amniograft (12/17/19); He returns sooner than planned, coimplaining of redness, discomfort, "small ball" left eye (medially) for last 2-3 weeks.  He has a pyogenic granuloma. Restart pred forte 1% OS, TID. Follow up PRN, or 4 weeks.  If doesn't improve with topical steroid, could consider surgical excision.

## 2020-02-19 NOTE — Progress Notes (Signed)
Pt here for up has red blister nasally OS x 2wks.    Painful,red,hot,uncomfortable    When it hurts eye tears.    Takes Advil every night helps with the pain.      S/p pterygium excision, with mitomycin C and amniograft (12/17/19).

## 2020-02-24 ENCOUNTER — Ambulatory Visit (HOSPITAL_BASED_OUTPATIENT_CLINIC_OR_DEPARTMENT_OTHER): Payer: No Typology Code available for payment source | Admitting: Physician Assistant

## 2020-02-25 ENCOUNTER — Telehealth (HOSPITAL_BASED_OUTPATIENT_CLINIC_OR_DEPARTMENT_OTHER): Payer: Self-pay

## 2020-02-25 NOTE — Telephone Encounter (Signed)
This is the eye center calling. Your glasses are here and ready for pick up. [facility] is open Monday though Friday, from 8:30 to 4:30 pm. It is closed for lunch from 12:30 to 1:30 pm. Please call our office at [phone] if you have any questions.

## 2020-02-29 ENCOUNTER — Encounter (HOSPITAL_BASED_OUTPATIENT_CLINIC_OR_DEPARTMENT_OTHER): Payer: Self-pay

## 2020-03-02 ENCOUNTER — Encounter (HOSPITAL_BASED_OUTPATIENT_CLINIC_OR_DEPARTMENT_OTHER): Payer: Self-pay

## 2020-03-02 ENCOUNTER — Ambulatory Visit (HOSPITAL_BASED_OUTPATIENT_CLINIC_OR_DEPARTMENT_OTHER): Payer: Self-pay | Admitting: Ophthalmology

## 2020-03-02 NOTE — Progress Notes (Signed)
I had preasure to see the patient to pick up glasses from the shop.

## 2020-03-30 ENCOUNTER — Other Ambulatory Visit: Payer: Self-pay

## 2020-03-30 ENCOUNTER — Ambulatory Visit: Payer: No Typology Code available for payment source | Admitting: Ophthalmology

## 2020-03-30 ENCOUNTER — Encounter (HOSPITAL_BASED_OUTPATIENT_CLINIC_OR_DEPARTMENT_OTHER): Payer: Self-pay | Admitting: Ophthalmology

## 2020-03-30 DIAGNOSIS — H11222 Conjunctival granuloma, left eye: Secondary | ICD-10-CM

## 2020-03-30 NOTE — Progress Notes (Incomplete)
F/U   1-pyogenic granuloma of left conjunctiva  2-pterygium OS    C/o left eye still red,and a little discomfort .

## 2020-03-30 NOTE — Progress Notes (Unsigned)
Patient presents with:  Follow Up: Patient with pygoenic granuloma, OS, s/p 12/17/19 pterygium excision, with mitomycin C and amniograft OS by VJP II. He is doing better, with red bump resolved with topical steroid drops. Still some redness (nasally) left eye, no pain.  He has only tiny residual granuloma (resolving).  Taper of pred forte OS (TID for 2 weeks, BID for 2 weeks, once a day for 2 weeks, then stop using this).  Follow up PRN, or 6 months.

## 2020-07-30 MED FILL — SOD CHLORIDE NEB 0.9%: 5 days supply | Qty: 90 | Fill #0

## 2020-09-14 ENCOUNTER — Ambulatory Visit
Admission: RE | Admit: 2020-09-14 | Discharge: 2020-09-14 | Disposition: A | Discharge status: Home / Self Care | Payer: No Typology Code available for payment source | Attending: Family Medicine | Admitting: Family Medicine

## 2020-09-14 DIAGNOSIS — Z1152 Encounter for screening for COVID-19: Secondary | ICD-10-CM | POA: Diagnosis present

## 2020-09-14 DIAGNOSIS — U071 COVID-19: Secondary | ICD-10-CM | POA: Diagnosis not present

## 2020-09-15 ENCOUNTER — Encounter (HOSPITAL_BASED_OUTPATIENT_CLINIC_OR_DEPARTMENT_OTHER): Payer: Self-pay

## 2020-09-15 ENCOUNTER — Emergency Department
Admission: EM | Admit: 2020-09-15 | Discharge: 2020-09-15 | Disposition: A | Discharge status: Home / Self Care | Payer: No Typology Code available for payment source | Attending: Physician Assistant | Admitting: Physician Assistant

## 2020-09-15 ENCOUNTER — Ambulatory Visit (HOSPITAL_BASED_OUTPATIENT_CLINIC_OR_DEPARTMENT_OTHER): Payer: Self-pay

## 2020-09-15 ENCOUNTER — Other Ambulatory Visit: Payer: Self-pay

## 2020-09-15 DIAGNOSIS — R509 Fever, unspecified: Secondary | ICD-10-CM | POA: Diagnosis present

## 2020-09-15 DIAGNOSIS — U071 COVID-19: Secondary | ICD-10-CM | POA: Insufficient documentation

## 2020-09-15 LAB — COVID-19 SURVEILLANCE: COVID-19 INPATIENT SURVEILLANCE: POSITIVE

## 2020-09-15 MED ORDER — IBUPROFEN 600 MG PO TABS
600.00 mg | ORAL_TABLET | Freq: Three times a day (TID) | ORAL | 0 refills | Status: AC | PRN
Start: 2020-09-15 — End: 2020-09-22

## 2020-09-15 MED ORDER — BENZONATATE 100 MG PO CAPS
100.00 mg | ORAL_CAPSULE | Freq: Three times a day (TID) | ORAL | 0 refills | Status: AC | PRN
Start: 2020-09-15 — End: 2020-09-20

## 2020-09-15 MED ORDER — ACETAMINOPHEN 325 MG PO TABS
325.00 mg | ORAL_TABLET | Freq: Four times a day (QID) | ORAL | 0 refills | Status: AC | PRN
Start: 2020-09-15 — End: 2020-10-15

## 2020-09-15 MED ORDER — MENTHOL 3 MG MT LOZG
1.00 | LOZENGE | OROMUCOSAL | 0 refills | Status: AC | PRN
Start: 2020-09-15 — End: 2020-09-20

## 2020-09-15 NOTE — Telephone Encounter (Signed)
Reason for Disposition   Severe pain in one eye   SEVERE headache, states 'worst headache' of life     Pt called with covid like symptoms, but also reports having complaining of right sided headache radiating in the right eye mostly and a little in the left. Rating pain 8/10 sore throat and cough . Based of the intensity of the pain advised to be seen in the ED sent to the [facility] for and evaluation.    Answer Assessment - Initial Assessment Questions  1. LOCATION: "Where does it hurt?"       Right sided and eye area  2. ONSET: "When did the headache start?" (Minutes, hours or days)       2 days   3. PATTERN: "Does the pain come and go, or has it been constant since it started?"      On and off  4. SEVERITY: "How bad is the pain?" and "What does it keep you from doing?"  (e.g., Scale 1-10; mild, moderate, or severe)    - MILD (1-3): doesn't interfere with normal activities     - MODERATE (4-7): interferes with normal activities or awakens from sleep     - SEVERE (8-10): excruciating pain, unable to do any normal activities         8/10   5. RECURRENT SYMPTOM: "Have you ever had headaches before?" If so, ask: "When was the last time?" and "What happened that time?"       Denies   6. CAUSE: "What do you think is causing the headache?"      Not sure maybe my eyes  7. MIGRAINE: "Have you been diagnosed with migraine headaches?" If so, ask: "Is this headache similar?"       Denies   8. HEAD INJURY: "Has there been any recent injury to the head?"       Denies   9. OTHER SYMPTOMS: "Do you have any other symptoms?" (fever, stiff neck, eye pain, sore throat, cold symptoms)      Eye pain  10. PREGNANCY: "Is there any chance you are pregnant?" "When was your last menstrual period?"        Denies    Protocols used: ADULT HEADACHE-A-OH

## 2020-09-15 NOTE — ED Nursing Notes (Signed)
Handoff report given to Jess, RN

## 2020-09-15 NOTE — Discharge Instructions (Signed)
Diagnstico e tratamento:  - voc ou seu ente querido foi visto na [employer] por causa de [place], dores no corpo  - a Nurse, mental health  tranquilizadora de que  seguro para voc ir para casa    Cuidados adicionais:  - voc tem COVID-19  - beba mais gua para ajudar com a tosse. Voc tambm pode tentar um umidificador  - tome os medicamentos prescritos para tratar sua condio    Novos medicamentos:  - tylenol / motrin (controle da dor / febre)  - tessalon / cepacol (dor de garganta / tosse)    Ligue para o seu provedor de cuidados primrios amanh:  - para inform-los sobre a sua visita hoje  - para solicitar uma consulta de acompanhamento, se necessrio  - se voc no tem um mdico de ateno primria ou [place] de transferir seus cuidados para [employer], ligue para [phone] para marcar uma consulta    V para o departamento de emergncia:  - para sintomas novos ou agravantes, incluindo se voc tem dificuldade para respirar, vmitos onde no consegue reter os lquidos  - se voc no conseguir agendar cuidados de acompanhamento  =====  Diagnosis and treatment:  - you or your loved one was seen at [facility] for fever, body aches   - your evaluation is reassuring that it is safe for you to go home    Further care:  - you have COVID-19  - drink more water to help with your cough. You can also try a humidifier  - take medications as prescribed to treat your condition    New medications:  - tylenol/motrin (pain/fever control)  - tessalon/cepacol (sore throat/cough)    Call your primary care provider tomorrow:  - to let them know about your visit today  - to request a follow up appointment if needed   - if you do not have a primary care doctor or would like to transfer your care to [facility], call [phone] to set up an appointment    Go to the emergency department:  - for new or worsening symptoms including if you have severe difficulty breathing,  vomiting where you cannot keep down fluids   - if you are unable to schedule follow up care

## 2020-09-15 NOTE — ED Provider Notes (Signed)
ED nursing record was reviewed. Prior medical records as available through Epic were reviewed    Primary care language: [place]   A hospital interpreter was used for the history, physical exam and subsequent hospital course    This patient was seen primarily by me    ED RN triage:   Triage Documentation     Vicki Mallet Smart, RN 09/15/2020 13:53             Pt self presents to ED c/o headache, fever, and body aches that started Monday during the night. Tested for COVID yesterday, and is positive. Took Advil 0700. Afebrile at triage, 98% on r/a.           HPI:    Jonathan [name] is a 49 year old male with past medical history significant for hyperlipidemia, IBS who presents at 89.    Patient presents for evaluation of multiple symptoms.  Fever, headache, body aches.  Ongoing for the past 3 days.  Denies vomiting, diarrhea, shortness of breath.  Has tested positive for COVID-19 at home. Symptoms persist despite use of ibuprofen, last this morning.    ROS: Pertinent positives were reviewed as per the HPI above. All other systems were reviewed and are negative.  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  11/21/2019: Pterygium of left eye  Patient Active Problem List:     Headache disorder     Other and unspecified hyperlipidemia     Allergic rhinitis, cause unspecified     Family history of stroke (cerebrovascular)     Sciatica     Esophageal reflux     Slow transit constipation     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     Pterygium of left eye     Pyogenic granuloma of left conjunctiva    Past Surgical History: Past Surgical History:  11/2003: EXCISION/TRANSPOSITION PTERYGIUM W/O GRAFT; Right      Comment:   s/p excision of pterygium OD by VJP II  12/17/2019: PTERYGIUM EXCISION W/ GRAFT; Left      Comment:  s/p pterygium excision, with mitomycin C and amniograft                OS by VJP II  Social History:   Social History     Socioeconomic History    Marital status: Single     Spouse name: Not on file    Number of children: Not on file    Years of education: Not on file    Highest education level: Not on file   Occupational History    Occupation: cook     Employer: [name]     Comment: In [place] 2002; 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, family   Tobacco Use    Smoking status: Never Smoker    Smokeless tobacco: Never Used   Substance and Sexual Activity    Alcohol use: No    Drug use: No    Sexual activity: Never     Comment: not sexually active   Other Topics Concern    Not on file   Social History Narrative    Working: Investment banker, 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[employer]  Single, no children        No smoking    ETOH - once a month    No drugs        Lives with brother and sister-in-law and 2 children   Social Determinants of Health  Financial Resource Strain:     Difficulty of Paying Living Expenses: Not on file  Food Insecurity:     Worried About Programme researcher, broadcasting/film/video in the Last Year: Not on file    [employer] of Food in the Last Year: Not on file  Transportation Needs:     Lack of Transportation (Medical): Not on file    Lack of Transportation (Non-Medical): Not on file  Physical Activity:     Days of Exercise per Week: Not on file    Minutes of Exercise per Session: Not on file  Stress:     Feeling of Stress : Not on file  Social Connections:     Frequency of Communication with Friends and Family: Not on file    Frequency of Social Gatherings with Friends and Family: Not on file    Attends Religious Services: Not on file    Active Member of Clubs or Organizations: Not on file    Attends Banker Meetings: Not on file    Marital Status: Not on file  Intimate Partner Violence:     Fear of  Current or Ex-Partner: Not on file    Emotionally Abused: Not on file    Physically Abused: Not on file    Sexually Abused: Not on file   Allergies: Review of Patient's Allergies indicates:  No Known Allergies  Immunizations:   Immunization History   Administered Date(s) Administered    HEP B ADULT 3 DOSE 20 and > 05/28/2003, 07/01/2003, 12/10/2003    PPD 05/26/2003    Td 05/26/2003          Medications:  Prior to Admission Medications   Prescriptions Last Dose Informant Patient Reported? Taking?   ibuprofen (ADVIL) 200 MG tablet   Yes No   Sig: Take 1 tablet by mouth nightly as needed for Pain      Facility-Administered Medications: None       Physical Exam:  ED Triage Vitals [09/15/20 1353]   ED Triage Vitals Brief Group      Temp 98.5 F      Pulse 94      Resp 18      BP 136/87      SpO2 99 %      Pain Score 4      GENERAL:  Conversant, well developed, in no acute distress  SKIN:  No rash, lesions or ulcers  HEENT:  NCAT; EOMI. Sclerae are anicteric and aninjected, no-lid lag or proptosis  RESPIRATORY:  Normal respiratory effort, equal chest rise and fall  CARDIOVASCULAR:  Well perfused, no peripheral edema  MSK/EXTREMITIES:  No obvious deformities.  No digital cyanosis. Normal gait and station  NEUROLOGIC:  Alert, oriented; moves all extremities; speaking in sentences. CNs II-XII grossly intact  PSYCHIATRIC:  Appropriate for age, time of day, and situation. Intact judgment and insight    Medications Given in the ED:  Medications - No data to display Radiology Results:  n/a   Lab Results (abnormal results only):  Labs Reviewed - No data to display Other Results/Old Record review (e.g. ECG):  n/a     ED Course/Medical Decision Making:  Patient's past medical history/problem list, past surgical history, medication list,  social history and allergies reviewed. Pertinent labs & imaging studies reviewed.  Prior records reviewed. Nursing notes reviewed.    Jonathan [name] is a 49 year old male who presented at  17 for evaluation of symptoms as above.    Patient is well appearing, in no acute distress on presentation.    Vitals reassuring. Presentation consistent with COVID-19 in the setting of known positive home test. Respiratory rate and O2 sat within normal limits on room air, no increased work of breathing, no acute respiratory distress. Respiratory panel testing deferred. Stable for ongoing self-care at home. Isolation advised. Reasons to return to the emergency department discussed.    Patient/parent/guardian has been provided with precautions to return to the ED for new or worsening symptoms, or if unable to obtain follow up care.     Stable for discharge with PCP follow up.  PCP: Lawana Chambers, MD    Disposition: Discharge   Condition: Stable    Diagnosis/Diagnoses:   COVID-19 virus infection    Prescribed Medications from this Visit:  Discharge Medication List as of 09/15/2020  3:31 PM    START taking these medications    acetaminophen (TYLENOL) 325 MG tablet  Take 1-2 tablets by mouth every 6 (six) hours as needed for Pain  Tamperproof, Disp-15 tablet, R-0    !! ibuprofen (ADVIL) 600 MG tablet  Take 1 tablet by mouth every 8 (eight) hours as needed pain  for up to 7 days  Tamperproof, Disp-20 tablet, R-0    menthol-cetylpyridinium (CEPACOL) 3 MG lozenge  Take 1 lozenge by mouth as needed for Pain  for up to 5 days  Tamperproof, Disp-18 lozenge, R-0    benzonatate (TESSALON) 100 MG capsule  Take 1 capsule by mouth 3 (three) times daily as needed for Cough  for up to 5 days  Tamperproof, Disp-15 capsule, R-0    !! - Potential duplicate medications found. Please discuss with provider.           Bronson Curb, PA-C, 09/15/2020 8:55 PM  This Emergency Department patient encounter note was created using voice-recognition software and in real time during the ED visit

## 2020-09-15 NOTE — ED Triage Note (Signed)
Pt self presents to ED c/o headache, fever, and body aches that started Monday during the night. Tested for COVID yesterday, and is positive. Took Advil 0700. Afebrile at triage, 98% on r/a.

## 2020-09-15 NOTE — Telephone Encounter (Signed)
Regarding: headaches, fever, sore throat   ----- Message from Philipp Deputy sent at 09/15/2020 11:46 AM EST -----  Jonathan [name] [id number], 49 year old, male    Calls today:  Sick    What are the symptoms headaches, fever, sore throat   How long has patient been sick? 2 days   What has pt. tried at home advil   Person calling on behalf of patient: Patient (self)    CALL BACK NUMBER: [phone]    Patient's language of care: [place]    Patient does not need an interpreter.    Patient's PCP: Lawana Chambers, MD

## 2020-09-15 NOTE — Narrator Note (Signed)
Patient Disposition  Patient education for diagnosis, medications, activity, diet and follow-up.  Patient left ED 3:46 PM.  Patient rep received written instructions.    Interpreter to provide instructions: No    Patient belongings with patient: YES    Have all existing LDAs been addressed? N/A    Have all IV infusions been stopped? N/A    Destination: Discharged to home

## 2020-09-23 ENCOUNTER — Ambulatory Visit: Payer: 344 | Attending: Internal Medicine

## 2020-09-23 ENCOUNTER — Other Ambulatory Visit: Payer: Self-pay

## 2020-09-23 DIAGNOSIS — Z23 Encounter for immunization: Secondary | ICD-10-CM | POA: Diagnosis not present

## 2020-10-05 ENCOUNTER — Other Ambulatory Visit: Payer: Self-pay

## 2020-10-05 ENCOUNTER — Ambulatory Visit: Payer: No Typology Code available for payment source | Attending: Ophthalmology | Admitting: Ophthalmology

## 2020-10-05 ENCOUNTER — Encounter (HOSPITAL_BASED_OUTPATIENT_CLINIC_OR_DEPARTMENT_OTHER): Payer: Self-pay | Admitting: Ophthalmology

## 2020-10-05 DIAGNOSIS — R519 Headache, unspecified: Secondary | ICD-10-CM | POA: Insufficient documentation

## 2020-10-05 DIAGNOSIS — H0102B Squamous blepharitis left eye, upper and lower eyelids: Secondary | ICD-10-CM | POA: Insufficient documentation

## 2020-10-05 DIAGNOSIS — H0102A Squamous blepharitis right eye, upper and lower eyelids: Secondary | ICD-10-CM | POA: Diagnosis present

## 2020-10-05 HISTORY — DX: Squamous blepharitis right eye, upper and lower eyelids: H01.02A

## 2020-10-05 MED ORDER — ERYTHROMYCIN 5 MG/GM OP OINT
TOPICAL_OINTMENT | OPHTHALMIC | 12 refills | Status: DC
Start: 2020-10-05 — End: 2021-09-14

## 2020-10-05 MED FILL — ERYTHROMYCIN OIN OP: 7 days supply | Qty: 4 | Fill #0

## 2020-10-05 NOTE — Patient Instructions (Signed)
Jonathan [name]    You have a common, and mild condition known as blepharitis.  It could be thought of as analogous to a dandruff of the eye lashes.  There is an inflammation and irritation of your eyelid margins in both eyes.  A flaky, but oily skin at the base of the eyelashes causes inflammation.  The normal bacteria on the skin overgrow.  This sheds into your eyes, causing your eyes to be itchy, swollen and red.  You should:  1) Soak your closed eyelids four five minutes at a time with a warm face clothe every day.  2) Clean the eyelid margins with diluted baby shampoo daily.  Use a teaspoon of baby shampoo in a glass of warm water, then dip a face clothe into this, and use to gently clean the lid margins once a day.  3) Apply the Ilotycin ointment which I prescribed for you, every evening, inside the eyelid and on the lid margin, once a day for a month.    [name] James Patalano II, MD  [phone]

## 2020-10-05 NOTE — Progress Notes (Addendum)
Patient presents with:  Dist VA Is Good.: Patient for comprehensive eye exam    Other:  s/p 12/17/19 pterygium excision, with mitomycin C and amniograft OS by VJP II, previously had pyogenic granuloma. No signs recurrence. Avoid UV.    Eye Pain: He had an episode two weeks ago, one day, woke up left eye, foreign body sensation and pain, and tearing, doing fine now.  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   He is instructed to apply warm compresses to his closed eyelids twice a day, for five minutes at a time.  He will clean his eyelid margins gently, with diluted baby shampoo every evening.  He is encouraged to apply Ilotycin ointment inside both eyelids every evening for at least one month.  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  He will use artificial tears in both eyes, 4-6 times a day.    Headache: Occassional headache, neck pain, perhaps once a week.  There is no ophthalmic cause or clue to the reasons for headaches after a complete ophthalmic examination, including dilated examination of the optic nerve or retina.  Specifically, there is no optic nerve head swelling (papilledema).  He will follow up with his PCP if the headaches persist, become more frequent or more severe, or he develops any other neurological symptoms or signs.

## 2020-10-05 NOTE — Progress Notes (Signed)
6 Month f/u    Pt states that today OS isn't bothering him but some days when he wakes, OS is red, tearing and uncomfortable.  Denies changes in va OU  Denies flashes or floaters ou  Doesn't use eye gtts    Ocular HX-Pyogenic granuloma of left conjunctiva ( s/p pterygium excision with mitomycin C and Amniograft VJP )     LEE 03-30-20

## 2020-10-14 ENCOUNTER — Other Ambulatory Visit: Payer: Self-pay

## 2020-10-14 ENCOUNTER — Ambulatory Visit: Payer: 344 | Attending: Internal Medicine

## 2020-10-14 DIAGNOSIS — Z23 Encounter for immunization: Secondary | ICD-10-CM | POA: Diagnosis present

## 2021-04-20 ENCOUNTER — Encounter (HOSPITAL_BASED_OUTPATIENT_CLINIC_OR_DEPARTMENT_OTHER): Payer: Self-pay

## 2021-04-20 NOTE — Progress Notes (Unsigned)
Pt called: having eye problem. I called twice today, no answer, I left voicemail to call us back.

## 2021-04-21 MED FILL — ERYTHROMYCIN OIN 5MG/GM: 7 days supply | Qty: 4 | Fill #1

## 2021-04-25 ENCOUNTER — Encounter (HOSPITAL_BASED_OUTPATIENT_CLINIC_OR_DEPARTMENT_OTHER): Payer: Self-pay | Admitting: Physician Assistant

## 2021-05-03 ENCOUNTER — Telehealth (HOSPITAL_BASED_OUTPATIENT_CLINIC_OR_DEPARTMENT_OTHER): Payer: Self-pay

## 2021-05-03 DIAGNOSIS — H5203 Hypermetropia, bilateral: Secondary | ICD-10-CM

## 2021-05-09 ENCOUNTER — Telehealth (HOSPITAL_BASED_OUTPATIENT_CLINIC_OR_DEPARTMENT_OTHER): Payer: Self-pay | Admitting: Family Medicine

## 2021-05-09 ENCOUNTER — Encounter (HOSPITAL_BASED_OUTPATIENT_CLINIC_OR_DEPARTMENT_OTHER): Payer: Self-pay | Admitting: Physician Assistant

## 2021-05-09 ENCOUNTER — Ambulatory Visit: Payer: No Typology Code available for payment source | Attending: Family Medicine | Admitting: Physician Assistant

## 2021-05-09 ENCOUNTER — Other Ambulatory Visit: Payer: Self-pay

## 2021-05-09 VITALS — BP 120/67 | HR 59 | Temp 97.2°F | Ht 65.04 in | Wt 148.8 lb

## 2021-05-09 DIAGNOSIS — H52203 Unspecified astigmatism, bilateral: Secondary | ICD-10-CM | POA: Insufficient documentation

## 2021-05-09 DIAGNOSIS — H0102A Squamous blepharitis right eye, upper and lower eyelids: Secondary | ICD-10-CM | POA: Diagnosis present

## 2021-05-09 DIAGNOSIS — Z0000 Encounter for general adult medical examination without abnormal findings: Secondary | ICD-10-CM | POA: Insufficient documentation

## 2021-05-09 DIAGNOSIS — E785 Hyperlipidemia, unspecified: Secondary | ICD-10-CM | POA: Insufficient documentation

## 2021-05-09 DIAGNOSIS — R200 Anesthesia of skin: Secondary | ICD-10-CM | POA: Insufficient documentation

## 2021-05-09 DIAGNOSIS — H524 Presbyopia: Secondary | ICD-10-CM | POA: Insufficient documentation

## 2021-05-09 DIAGNOSIS — H0102B Squamous blepharitis left eye, upper and lower eyelids: Secondary | ICD-10-CM | POA: Insufficient documentation

## 2021-05-09 DIAGNOSIS — Z23 Encounter for immunization: Secondary | ICD-10-CM | POA: Diagnosis present

## 2021-05-09 DIAGNOSIS — H5203 Hypermetropia, bilateral: Secondary | ICD-10-CM | POA: Diagnosis present

## 2021-05-09 DIAGNOSIS — Z131 Encounter for screening for diabetes mellitus: Secondary | ICD-10-CM | POA: Diagnosis present

## 2021-05-09 DIAGNOSIS — H538 Other visual disturbances: Secondary | ICD-10-CM | POA: Diagnosis not present

## 2021-05-09 LAB — LIPID PANEL
Cholesterol: 248 mg/dL — ABNORMAL HIGH (ref 0–239)
HIGH DENSITY LIPOPROTEIN: 37 mg/dL — ABNORMAL LOW (ref 40–60)
LOW DENSITY LIPOPROTEIN DIRECT: 176 mg/dL (ref 0–189)
TRIGLYCERIDES: 247 mg/dL — ABNORMAL HIGH (ref 0–150)

## 2021-05-09 LAB — COMPREHENSIVE METABOLIC PANEL
ALANINE AMINOTRANSFERASE: 47 U/L — ABNORMAL HIGH (ref 12–45)
ALBUMIN: 4.6 g/dL (ref 3.4–5.2)
ALKALINE PHOSPHATASE: 90 U/L (ref 45–117)
ANION GAP: 14 mmol/L (ref 10–22)
ASPARTATE AMINOTRANSFERASE: 31 U/L (ref 8–34)
BILIRUBIN TOTAL: 0.3 mg/dL (ref 0.2–1.0)
BUN (UREA NITROGEN): 12 mg/dL (ref 7–18)
CALCIUM: 9.7 mg/dl (ref 8.5–10.1)
CARBON DIOXIDE: 26 mmol/L (ref 21–32)
CHLORIDE: 104 mmol/L (ref 98–107)
CREATININE: 0.8 mg/dL (ref 0.7–1.2)
ESTIMATED GLOMERULAR FILT RATE: >60 mL/min (ref >60)
Glucose Random: 91 mg/dL (ref 74–160)
POTASSIUM: 4.3 mmol/L (ref 3.5–5.1)
SODIUM: 144 mmol/L (ref 136–145)
TOTAL PROTEIN: 7.1 g/dL (ref 6.4–8.2)

## 2021-05-09 LAB — HEMOGLOBIN A1C
ESTIMATED AVERAGE GLUCOSE: 114 mg/dL (ref 74–160)
HEMOGLOBIN A1C: 5.6 % (ref 4.0–5.6)

## 2021-05-09 NOTE — Telephone Encounter (Signed)
Nurse from [facility] called the [facility] to complete a benefit analysis for the tdap Vaccine.    The vaccine is covered under the patient's [facility] medical coverage.    Please choose Private

## 2021-05-09 NOTE — Progress Notes (Signed)
05/09/2021  VIS given prior to administration and reviewed with the patient and or legal guardian. Patient understands the disease and the vaccine. See immunization/Injection module or chart review for date of publication and additional information.  Ebony Yorio Menezes-Odell, RN  Covid-19 Vaccine Administration:    Patient screened according to guidelines.Vaccine Information Fact Sheet given prior to administration and all patient/parentquestions addressed. Covid-19 vaccine given without incident. Possible side effects reviewed. Returnto clinicfor nextscheduled dose if indicated.   pt has agreed in rtc in 2-4 months for shingles #2  Bentley Fissel Menezes-Odell, RN
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
## 2021-05-10 ENCOUNTER — Encounter (HOSPITAL_BASED_OUTPATIENT_CLINIC_OR_DEPARTMENT_OTHER): Payer: Self-pay | Admitting: Physician Assistant

## 2021-05-11 ENCOUNTER — Other Ambulatory Visit: Payer: Self-pay

## 2021-05-12 ENCOUNTER — Ambulatory Visit: Payer: No Typology Code available for payment source | Attending: Ophthalmology | Admitting: Ophthalmology

## 2021-05-12 ENCOUNTER — Encounter (HOSPITAL_BASED_OUTPATIENT_CLINIC_OR_DEPARTMENT_OTHER): Payer: Self-pay | Admitting: Ophthalmology

## 2021-05-12 DIAGNOSIS — H524 Presbyopia: Secondary | ICD-10-CM | POA: Diagnosis present

## 2021-05-12 DIAGNOSIS — H52203 Unspecified astigmatism, bilateral: Secondary | ICD-10-CM | POA: Insufficient documentation

## 2021-05-12 DIAGNOSIS — G43109 Migraine with aura, not intractable, without status migrainosus: Secondary | ICD-10-CM | POA: Insufficient documentation

## 2021-05-12 DIAGNOSIS — H5203 Hypermetropia, bilateral: Secondary | ICD-10-CM | POA: Diagnosis present

## 2021-05-12 HISTORY — DX: Migraine with aura, not intractable, without status migrainosus: G43.109

## 2021-05-12 NOTE — Progress Notes (Signed)
Here for an Urgent Visit,H/O S/P Pterygium excision with mmc and graft OS(12/17/2019),Hyperopia,Astigamtsim,Presbyopia,Blepharitis.   Vision is blurry without glasses.   No pain.   On and off headache for few weeks.   No nausea/vomiting.   Spots in vision occasional OU.   No flashes of light.   Denies ocular/head trauma.    Ocular meds   At's PRN OU(from [place])

## 2021-05-12 NOTE — Progress Notes (Signed)
Patient presents with:  Blurry VA: Cannot see well without glasses,vision is good with glasses,on and off headcahe and spots in vision for few weeks  Other: He had an episode once, two seeks ago, lasting twenty to thirty minutes, vision both eyes simultaneous, visual pattern, blocking his vision, followed by strong headache, then resolved. None now.  This is likely migrainous.  No ophthalmic cause or clue.  Follow up PRN, with PCP.    Hyperopia with astigmatism and presbyopia. He's given a prescription for glasses.    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 No signs recurrence. Avoid UV.

## 2021-06-01 ENCOUNTER — Ambulatory Visit: Payer: No Typology Code available for payment source | Attending: Family Medicine | Admitting: Physician Assistant

## 2021-06-01 ENCOUNTER — Other Ambulatory Visit: Payer: Self-pay

## 2021-06-01 ENCOUNTER — Encounter (HOSPITAL_BASED_OUTPATIENT_CLINIC_OR_DEPARTMENT_OTHER): Payer: Self-pay | Admitting: Physician Assistant

## 2021-06-01 VITALS — BP 124/72 | HR 84 | Temp 97.5°F | Ht 65.04 in | Wt 150.2 lb

## 2021-06-01 DIAGNOSIS — R200 Anesthesia of skin: Secondary | ICD-10-CM | POA: Insufficient documentation

## 2021-06-01 DIAGNOSIS — M79672 Pain in left foot: Secondary | ICD-10-CM | POA: Insufficient documentation

## 2021-06-01 DIAGNOSIS — Z23 Encounter for immunization: Secondary | ICD-10-CM | POA: Insufficient documentation

## 2021-06-01 DIAGNOSIS — G5623 Lesion of ulnar nerve, bilateral upper limbs: Secondary | ICD-10-CM | POA: Diagnosis present

## 2021-06-01 NOTE — Progress Notes (Signed)
SUBJECTIVE:  Jonathan [name] is a 50 year old male who presents for:    [place] Educational psychologist Used.     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       ALLERGIES:  Review of Patient's Allergies indicates:  No Known Allergies    ROS:  Negative other  than mentioned in HPI     OBJECTIVE:    VITALS:  BP 124/72    Pulse 84    Temp 97.5 F (36.4 C) (Temporal)    Ht 5' 5.04" (1.652 m)    Wt 68.1 kg (150 lb 3.2 oz)    SpO2 98%    BMI 24.96 kg/m   General: Well appearing, no apparent distress. Alert and oriented x 3.  Eyes: PERRLA, EOMI. No scleral icterus.  Neck: supple  Heart: regular rate and rhythm, normal S1 and S2, no murmurs, rubs or gallops.  Lungs:  Clear to auscultation bilaterally, no wheezing or rales  Abdomen: soft, non-tender, non-distended  Spine: no visible abnormalities  Skin: No rashes, lesions or atypical nevi  Neuro: Cranial nerves intact, grossly normal strength and sensation. Normal gait  Extremities: thumbs w/o TTP, grip and thumb strength intact. L foot with area of TTP on dorsal mid-foot, no fluctuance, induration, or acute swelling. Bilateral elbows  without TTP, no swelling, full ROM, strength 5/5. Otherwise warm, well perfused, no edema or cyanosis.   Psych: Normal affect, normal insight and judgment.     ASSESSMENT/PLAN:   PROBLEMS:   Jonathan [name] is a 50 year old male here for:    1. Thumb numbness  As above. Exam unremarkable. Given numbness is only present when biking, most likely symptoms are only due to momentary compression when biking. Reviewed grip and arm positioning, and advised patient to use his riding gloves to help pad his hands/prevent nerve compression. Patient agrees, will try them. Will f/u if symptoms do not improve.   - f/u PRN     2. Inflammation of both ulnar nerves  As above. Low concern for acute infection, injury, etc. Most likely inflammation around ulnar nerves given repetitive motions at work, that is being compressed when leaning on elbows. Advised taking breaks when possible, alternating activities at work when possible, and icing at home. If no improvement, can send to PT.   - f/u PRN     3. Left foot pain  As above. Low concern for infection or fracture. Does not seem cystic. However, given chronic pain x 3  years, sending to podiatry.   - REFERRAL TO PODIATRY ([facility])    4. Need for prophylactic vaccination and inoculation against influenza  - IMMUNIZATION ADMIN SINGLE  - IIV4 VACC PRESERV FREE AGE 37 MONTHS AND OLDER, 0.5ML, IM        1. Return to clinic if symptoms don't improve or worsen. Discussed when to return to office vs seek emergent care.  2. The patient indicates understanding of these issues and agrees with the plan.   No apparent learning barriers were identified. I attempted to answer any questions regarding the diagnosis and the proposed treatment.  3.  The patient is given an After Visit Summary sheet that lists all of their medications with directions, their allergies, orders placed during this encounter, and follow- up instructions.  4. I reviewed the patient's medical information, allergies and medications. I reviewed the potential side effects of any new medications prescribed.     I spent a total of 40 minutes on this visit on the date of service (total time includes all activities performed on the date of service)      Electronically signed by: Benjamin Stain, PA-C, 06/01/2021 10:16 AM  This note is electronically signed in the electronic medical record.

## 2021-06-01 NOTE — Patient Instructions (Signed)
Podiatry: [phone]

## 2021-06-01 NOTE — Progress Notes (Signed)
Influenza Vaccine Procedure  June 01, 2021    1. Has the patient received the information for the influenza vaccine? Yes    2. Does the patient have any of the following contraindications?  Allergy to eggs? No  Allergic reaction to previous influenza vaccines? No  Any other problems to previous influenza vaccines? No  Paralyzed by Guillain-Barre syndrome?  No  Current moderate or severe illness? No  Allergy to contact lens solution? No    3. The vaccine has been administered in the usual fashion.     Immunization information reviewed. Current VIS reviewed and given to patient/ guardian. Verbal assent obtained from patient/ guardian.  See immunization/Injection module or chart review for date of publication and additional information. Verbal assent obtained from patient/guardian. Comfort measures for possible side effects reviewed.

## 2021-06-08 ENCOUNTER — Other Ambulatory Visit: Payer: Self-pay

## 2021-07-18 ENCOUNTER — Other Ambulatory Visit: Payer: Self-pay

## 2021-07-18 ENCOUNTER — Ambulatory Visit
Admission: RE | Admit: 2021-07-18 | Discharge: 2021-07-18 | Disposition: A | Discharge status: Home / Self Care | Payer: No Typology Code available for payment source | Attending: Foot & Ankle Surgery | Admitting: Foot & Ankle Surgery

## 2021-07-18 ENCOUNTER — Ambulatory Visit (HOSPITAL_BASED_OUTPATIENT_CLINIC_OR_DEPARTMENT_OTHER): Payer: No Typology Code available for payment source | Admitting: Foot & Ankle Surgery

## 2021-07-18 VITALS — BP 110/74 | HR 85 | Temp 96.4°F

## 2021-07-18 DIAGNOSIS — M25572 Pain in left ankle and joints of left foot: Secondary | ICD-10-CM | POA: Insufficient documentation

## 2021-07-18 DIAGNOSIS — Q6672 Congenital pes cavus, left foot: Secondary | ICD-10-CM | POA: Diagnosis present

## 2021-07-18 DIAGNOSIS — M19072 Primary osteoarthritis, left ankle and foot: Secondary | ICD-10-CM | POA: Insufficient documentation

## 2021-07-18 DIAGNOSIS — M79672 Pain in left foot: Secondary | ICD-10-CM | POA: Diagnosis present

## 2021-07-18 MED ORDER — DICLOFENAC SODIUM 1 % EX GEL
4.0000 g | Freq: Four times a day (QID) | CUTANEOUS | 2 refills | Status: DC
Start: 2021-07-18 — End: 2021-09-14

## 2021-07-18 MED FILL — DICLOFENAC SOD GL 1% 100GM TOP: 6 days supply | Qty: 100 | Fill #0 | Status: CP

## 2021-07-18 NOTE — Progress Notes (Signed)
50 year old male presents with a [place] interpreter chief complaint of pain on the top of his left foot.  This happened approximately 5 years ago when he wore a shoe with no tongue.  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  He had pain on the top of the foot.  Now whenever he wear shoes on the area makes it worse.  The acute pain has resolved but now he has pain whenever the weather is cold.  Now shoes no longer bother him as the pain is only present in the cold months.  Last time this happened, the pain lasted for about 2 months.  Other treatment has included warm compresses as well as warm water and vinegar as well as a removable insole.    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    Review of Patient's Allergies indicates:  No Known Allergies  Social History     Socioeconomic History    Marital status: Single     Spouse name: Not on file    Number of children: Not on file    Years of education: Not on file    Highest  education level: Not on file   Occupational History    Occupation: cook     Employer: [name]     Comment: In [place] 2002; 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, family   Tobacco Use    Smoking status: Never Smoker    Smokeless tobacco: Never Used   Substance and Sexual Activity    Alcohol use: No    Drug use: No    Sexual activity: Never     Comment: not sexually active   Other Topics Concern    Not on file   Social History Narrative    Working: Investment banker, 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[employer], no children        No smoking    ETOH - once a month    No drugs        Lives with brother and sister-in-law and 2 children   Social Determinants of Health  Financial Resource Strain: Not on file  Food Insecurity: Not on file  Transportation Needs: Not on file  Physical Activity: Not on file  Stress: Not on file  Social Connections: Not on file  Intimate Partner Violence: Not on file  Housing Stability: Not on  file    Review of patient's family history indicates:  Problem: Stroke      Relation: Father          Age of Onset: (Not Specified)          Comment: 53  Problem: GI      Relation: Mother          Age of Onset: (Not Specified)          Comment: died 110 after ?GI surgery  Problem: Blood Disease      Relation: Brother          Age of Onset: (Not Specified)          Comment: not sure what     ROS - No F/C/N/V, SOB, chest pain or calf pain.  Left foot pain    Physical examination: Patient is alert and oriented x3 no apparent distress.  Dorsalis pedis and posterior tibial pulses are 2/4 bilateral.  Capillary refill time is 1 seconds absent.  Temperature gradient is within normal limits.  Neurologic sensation is grossly intact.  Irofol G bilateral.  Mild edema overlying the tarsometatarsal joint on the left foot.  Tenderness overlying the first and second tarsometatarsal joint area.  Negative Tinel's sign.    Assessment: Cavus foot, mild DJD left foot    Treatment: Discussed the finds with the patient.  This seems to be mild  degenerative changes in the area of the tarsometatarsal joint.  This also the peak of his cavus foot.  X-rays were taken and films personally reviewed by me.  High arch morphology noted.  Mild soft tissue swelling over the first and second tarsometatarsal joint with mild degenerative changes noted.  I have prescribed Voltaren gel to apply over the area of maximal tenderness.  He will keep his feet warm.  He will follow-up in 3 months to see if he is had any improvement and to review his x-rays if necessary.

## 2021-08-03 ENCOUNTER — Ambulatory Visit (HOSPITAL_BASED_OUTPATIENT_CLINIC_OR_DEPARTMENT_OTHER): Payer: Self-pay | Admitting: Ophthalmology

## 2021-09-14 ENCOUNTER — Emergency Department
Admission: AD | Admit: 2021-09-14 | Discharge: 2021-09-14 | Disposition: A | Discharge status: Home / Self Care | Payer: No Typology Code available for payment source | Source: Ambulatory Visit | Attending: Emergency Medicine | Admitting: Emergency Medicine

## 2021-09-14 ENCOUNTER — Encounter (HOSPITAL_BASED_OUTPATIENT_CLINIC_OR_DEPARTMENT_OTHER): Payer: Self-pay

## 2021-09-14 ENCOUNTER — Ambulatory Visit (HOSPITAL_BASED_OUTPATIENT_CLINIC_OR_DEPARTMENT_OTHER): Payer: Self-pay | Admitting: Registered Nurse

## 2021-09-14 DIAGNOSIS — H04123 Dry eye syndrome of bilateral lacrimal glands: Secondary | ICD-10-CM | POA: Diagnosis present

## 2021-09-14 DIAGNOSIS — H04129 Dry eye syndrome of unspecified lacrimal gland: Secondary | ICD-10-CM | POA: Insufficient documentation

## 2021-09-14 DIAGNOSIS — J069 Acute upper respiratory infection, unspecified: Secondary | ICD-10-CM | POA: Diagnosis not present

## 2021-09-14 DIAGNOSIS — Z20822 Contact with and (suspected) exposure to covid-19: Secondary | ICD-10-CM | POA: Diagnosis present

## 2021-09-14 MED ORDER — HYPROMELLOSE (GONIOSCOPIC) 2.5 % OP SOLN
1.00 [drp] | Freq: Three times a day (TID) | OPHTHALMIC | 0 refills | Status: AC
Start: 2021-09-14 — End: 2021-09-28

## 2021-09-14 NOTE — Narrator Note (Signed)
Patient Disposition  Patient education for diagnosis, medications, activity, diet and follow-up.  Patient left UC 3:49 PM.  Patient rep received written instructions.    Interpreter to provide instructions: No    Patient belongings with patient: YES    Have all existing LDAs been addressed? N/A    Have all IV infusions been stopped? N/A    Destination: Home

## 2021-09-14 NOTE — UC Triage Notes (Addendum)
Presents with fevers, headaches, and "eyes are burning x 4  days. Pt with history of blepharitis. Pt took advil @ 1100.

## 2021-09-14 NOTE — UC Provider Notes (Signed)
ED nursing record was reviewed. Prior records as available electronically through the Epic record were reviewed.    Patient is [place] speaking and interview, exam, and final disposition were conducted via an official hospital interpreter.    HPI:    This 50 year old male presents to the Emergency Department with chief complaint of flulike symptoms x3 days.  Reports chills, subjective fever, headache, body aches, nasal congestion, dry throat.  He has also been feeling nauseous and dry heaving.  No vomiting.  No diarrhea.  No abdominal pain.  Denies cough, shortness of breath.  Denies chest pain.  States he is also been having chronic dry eyes since his pterygium excision 9 months ago.  He has been using multiple over-the-counter eyedrops without improvement.  He has no eye pain, redness, blurred vision, double vision, foreign body sensation, irritation of the eyelids.    ROS: Pertinent positives were reviewed as per the HPI above. All other systems were reviewed and are negative.    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  05/12/2021: Migraine with aura and without status migrainosus, not   intractable  11/21/2019: Pterygium of left eye  10/05/2020: Squamous blepharitis of upper and lower eyelids of both   eyes  Patient Active Problem List:     Headache disorder     Other and unspecified hyperlipidemia     Allergic rhinitis, cause unspecified     Family history of stroke (cerebrovascular)     Sciatica     Esophageal reflux     Irritable bowel syndrome (IBS)     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  [DISCONTINUED] diclofenac (VOLTAREN) 1 % GEL Gel, Apply 4 g topically in the morning and 4 g at noon and 4 g in the evening and 4 g before bedtime., Disp: 100 g, Rfl: 2  [DISCONTINUED] erythromycin (ROMYCIN) ophthalmic ointment, Apply to both eyes, every evening, Disp: 3.5 g, Rfl: 12  [DISCONTINUED] ibuprofen (ADVIL) 200 MG tablet, Take 1 tablet by mouth nightly as needed for Pain, Disp: , Rfl:           Social History:   Social History     Socioeconomic History    Marital status: Single     Spouse name: Not on file    Number of children: Not on file    Years of education: Not on file    Highest education level: Not on file   Occupational History    Occupation: cook     Employer: [name]     Comment: In [place] 2002; 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, family   Tobacco Use    Smoking status: Never    Smokeless tobacco: Never   Substance and Sexual  Activity    Alcohol use: No    Drug use: No    Sexual activity: Never     Comment: not sexually active   Other Topics Concern    Not on file   Social History Narrative    Working: Investment banker, 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[employer], no children        No smoking    ETOH - once a month    No drugs        Lives with brother and sister-in-law and 2 children   Social Determinants of Health  Financial Resource Strain: Not on file  Food Insecurity: Not on file  Transportation Needs: Not on file  Physical Activity: Not on file  Stress: Not on file  Social Connections: Not on file  Intimate Partner Violence: Not on file  Housing Stability: Not on file        Allergies:  Review of Patient's Allergies indicates:  No Known Allergies      Physical Exam:  BP 130/90    Pulse 74    Temp 98.3 F (Temporal)    Resp 18    Wt 66.7 kg (147 lb)    SpO2 96%     BMI 24.43 kg/m     GENERAL: Well appearing, No acute distress, non-toxic.   SKIN:  Pink, warm, and dry. No erythema or rash.  HEAD:  Normocephalic, atraumatic. Sclerae are anicteric and non-injected.  PERRL.  EOMI.  No swelling or erythema of the eyelids.  No discharge.  Oropharynx is clear with moist mucous membranes. Tonsils symmetrical without erythema, edema, or exudates.  Bilateral TMs clear. No mastoid tenderness or erythema.   NECK:  Supple with full range of motion. No meningismus.  No lymphadenopathy.   LUNGS:  Clear to auscultation bilaterally. No wheezes, rales, rhonchi.   HEART:  RRR.  No appreciated murmurs.   ABDOMEN:  Soft, nontender, nondistended.  EXTREMITIES:  No obvious deformities.    NEUROLOGIC:  Alert and oriented x4; moves all extremities well; speaking in clear fluent sentences.   PSYCHIATRIC:  Appropriate for age, time of day, and situation      RESULTS  No results found for this visit on 09/14/21 (from the past 24 hour(s)).     RADIOLOGY      MEDICATIONS ADMINISTERED ON THIS VISIT  Orders Placed This Encounter      hydroxypropyl methylcellulose (ISOPTO TEARS) 2.5 % ophthalmic solution          Sig: Place 1 drop into both eyes in the morning and 1 drop at noon and 1 drop before bedtime. Do all this for 14 days.          Dispense:  15 mL          Refill:  0         ED Course and Medical Decision-making:  This 50 year old male patient presents for evaluation of flulike symptoms x3days.  On exam, patient is well-appearing and in no acute distress.  They are afebrile and vital signs are within normal limits.  Physical exam is unremarkable.    Presentation consistent with viral syndrome.  High suspicion for COVID-19 versus influenza.  He does not have any complaints of eye pain, vision changes, or redness.  He feels that his eyes have been chronically dry since his surgery 9 months ago.  Given prescription for artificial tears.  They appear well-hydrated.  Recommend ibuprofen/acetaminophen for  pain  or fevers at home.  Recommend increase fluids.  They are given strict return precautions.    Patient was instructed to follow-up with PCP in 7days for re-evaluation.  Signs and symptoms for which to return to the Emergency Department were discussed with the patient in detail, and they understand and are in agreement with their care plan at this time.    The patient remained hemodynamically stable throughout entire visit.    Condition: stable  Disposition: home      Diagnosis/Diagnoses:  Dry eye  Viral URI    Dorris Carnes, PA-C  Emergency Department  [facility]    This Emergency Department patient encounter note was created using voice-recognition software and in real time during the ED visit. Please excuse any typographical errors that have not been edited out.

## 2021-09-14 NOTE — Telephone Encounter (Signed)
Regarding: fever + vomiting + itchy/burning eyes + on and off diarrhea  ----- Message from Narda Amber sent at 09/14/2021 12:01 PM EST -----  Jonathan [name] [id number], 50 year old, male    Calls today:  Sick    What are the symptoms fever + vomiting + itchy/burning eyes + on and off diarrhea  How long has patient been sick? 3 days   What has pt. tried at home eye drop and advil     Person calling on behalf of patient: Patient (self)    CALL BACK NUMBER: [phone]    Best time to call back: any  Cell phone:   Other phone:    Patient's language of care: [place] ([place])    Patient needs a [place] interpreter.    Patient's PCP: Zack Seal, MD

## 2021-09-14 NOTE — Telephone Encounter (Signed)
COVIDTRIAGE -- INITIAL TRIAGE:    I have confirmed the patient's name and DOB. Yes     Emergency care:    1. Is the patient patient gasping for air or unable to speak? No  2. Does the patient have new severe chest pain? No  3. Is the patient lethargic or does the patient have altered mental status?  No    A) COVID symptoms (criteria for testing):    The patient has the following NEW symptoms (past 14 days): Fever, Diarrhea and Other: headache and vomiting    Current day of symptoms: 3    If dyspnea, duration of dyspnea: N/A    If fever, duration of fever: since yesterday    Other symptoms: please see above    C) Need for in-person evaluation:    1. Is the patient experiencing respiratory symptoms (dypsnea, orthopnea, dyspnea on exertion, wheezing), worsening respiratory symptoms from baseline, or worsening of a chronic cough? No  2. Has the patient had a fever for > 48 hours? No  3. Is the patient at DOI ? 4 with worsening symptoms? No  4. Does the patient have chest pain (note: new severe chest pain must be referred to ED)? No  5. Is the patient dizzy or lightheaded? No  6. Does the patient have severe sore throat? No  7. Is the patient pregnant? No  8. Is the patient requesting in-person evaluation?  Yes    Disposition:    1. I have provided the patient with Home Care Advice (use COVIDHOMECARE). Yes   2. I have ordered testing (for symptomatic patients not requiring in-person evaluation, and asymptomatic contacts). N/A.  **Patients requiring testing only should be scheduled at [facility]. They should be scheduled at [facility] only if unable to get to [facility].**  3. I have given the patient directions to Drive-Thru Testing (use DRIVETHRUDIRECTIONS) or [facility] (ACCDIRECTIONS), if appropriate. N/A    Final disposition: Other: Urgent care today

## 2021-09-15 LAB — RESPIRATORY PANEL BASIC OUTPT
INFLUENZA A: NEGATIVE
INFLUENZA B: NEGATIVE
RESPIRATORY SYNCYTIAL VIRUS: NEGATIVE
SARS-COV-2: NEGATIVE

## 2021-09-23 ENCOUNTER — Encounter (HOSPITAL_BASED_OUTPATIENT_CLINIC_OR_DEPARTMENT_OTHER): Payer: Self-pay | Admitting: Medical

## 2021-09-23 ENCOUNTER — Ambulatory Visit: Payer: No Typology Code available for payment source | Attending: Family Medicine | Admitting: Medical

## 2021-09-23 ENCOUNTER — Other Ambulatory Visit: Payer: Self-pay

## 2021-09-23 VITALS — BP 130/75 | HR 83 | Temp 97.7°F | Ht 65.0 in | Wt 150.0 lb

## 2021-09-23 DIAGNOSIS — H0102B Squamous blepharitis left eye, upper and lower eyelids: Secondary | ICD-10-CM | POA: Insufficient documentation

## 2021-09-23 DIAGNOSIS — H5789 Other specified disorders of eye and adnexa: Secondary | ICD-10-CM | POA: Insufficient documentation

## 2021-09-23 DIAGNOSIS — Z23 Encounter for immunization: Secondary | ICD-10-CM | POA: Insufficient documentation

## 2021-09-23 DIAGNOSIS — H0102A Squamous blepharitis right eye, upper and lower eyelids: Secondary | ICD-10-CM | POA: Diagnosis present

## 2021-09-23 MED ORDER — CYCLOSPORINE 0.05 % OP EMUL
1.00 [drp] | Freq: Two times a day (BID) | OPHTHALMIC | 1 refills | Status: AC
Start: 2021-09-23 — End: 2021-10-07

## 2021-09-23 MED ORDER — ERYTHROMYCIN 5 MG/GM OP OINT
TOPICAL_OINTMENT | OPHTHALMIC | 3 refills | Status: DC
Start: 2021-09-23 — End: 2021-10-24

## 2021-09-23 MED FILL — ERYTHROMYCIN OIN OP: 7 days supply | Qty: 4 | Fill #0 | Status: CP

## 2021-09-23 MED FILL — RESTASIS   EMU 0.05%: 30 days supply | Qty: 30 | Fill #0 | Status: CP

## 2021-09-23 NOTE — Progress Notes (Signed)
[facility] Visit Note   Subjective:   LANGUAGE: [place] telephone interpreter was used due to language barrier.    Eye Irritation  - Has blepharitis, used erythromycin for a few days but not currently  - Using [place] eye drops for hydration  - History of pterygium   - Wears corrective lenses  - No eye pain    I have reviewed the past medical, social, and family history. All pertinent historical factors are mentioned in the HPI. The remainder are non-contributory.     Objective:   BP 130/75    Pulse 83    Temp 97.7 F (36.5 C) (Temporal)    Ht 5\' 5"  (1.651 m)    Wt 68 kg (150 lb)    SpO2 97%    BMI 24.96 kg/m     Physical Exam  Constitutional: Well-developed and well-nourished. No distress.  Pulmonary/Chest: Effort normal, no dyspnea.  Musculoskeletal: Normal range of motion.  Neurological: Alert and oriented  Skin: Skin is warm and dry.    Assessment & Plan:     1. Squamous blepharitis of upper and lower eyelids of both eyes  2. Eye redness  Eye irritation and redness, diagnosed with blepharitis in past, restart erythromycin ointment and add drops for red eye relief.  - cycloSPORINE (RESTASIS) 0.05 % ophthalmic emulsion  - erythromycin (ROMYCIN) ophthalmic ointment    3. COVID-19 vaccine administered  Given today  - IMMUNIZATION ADMIN SINGLE  - PFIZER  COVID-19 VACCINE ( BIVALENT 53YO> BOOSTER)       The patient expressed understanding and no barriers to adherence were identified. Discussed reasons to call office or seek immediate medical care including: no improvement or worsening of symptoms, new concerns, or questions. The patient indicates understanding of these issues and agrees with the plan.    Ennis Forts, PA-C 09/23/2021

## 2021-09-23 NOTE — Progress Notes (Signed)
Patient due for both Covid-19 booster and shingrix 2nd dose    Patient decided to have covid-19 on;ly today will schedule F/U for shingrix    09/23/2021  VIS given prior to administration and reviewed with the patient and or legal guardian. Patient understands the disease and the vaccine. See immunization/Injection module or chart review for date of publication and additional information.  Geanette Buonocore, RN

## 2021-09-25 ENCOUNTER — Encounter (HOSPITAL_BASED_OUTPATIENT_CLINIC_OR_DEPARTMENT_OTHER): Payer: Self-pay | Admitting: Physician Assistant

## 2021-10-07 ENCOUNTER — Other Ambulatory Visit: Payer: Self-pay

## 2021-10-07 ENCOUNTER — Ambulatory Visit: Payer: No Typology Code available for payment source | Attending: Family Medicine | Admitting: Registered Nurse

## 2021-10-07 DIAGNOSIS — Z23 Encounter for immunization: Secondary | ICD-10-CM | POA: Diagnosis present

## 2021-10-07 NOTE — Progress Notes (Signed)
10/07/2021  VIS given prior to administration and reviewed with the patient and or legal guardian. Patient understands the disease and the vaccine. See immunization/Injection module or chart review for date of publication and additional information.  Gilford Raid, RN

## 2021-10-17 ENCOUNTER — Ambulatory Visit: Payer: No Typology Code available for payment source | Attending: Foot & Ankle Surgery | Admitting: Foot & Ankle Surgery

## 2021-10-17 ENCOUNTER — Other Ambulatory Visit: Payer: Self-pay

## 2021-10-17 DIAGNOSIS — Q6672 Congenital pes cavus, left foot: Secondary | ICD-10-CM

## 2021-10-17 MED ORDER — DICLOFENAC SODIUM 1 % EX GEL
2.00 g | Freq: Four times a day (QID) | CUTANEOUS | 2 refills | Status: AC
Start: 2021-10-17 — End: 2021-11-16

## 2021-10-17 MED FILL — DICLOFENAC GEL 1%: 12 days supply | Qty: 100 | Fill #0 | Status: CP

## 2021-10-17 NOTE — Progress Notes (Signed)
ATTENDING NOTE    I have personally seen and examined this patient. I have fully participated in the care of this patient. I have reviewed and agree with all pertinent clinical information including history, physical exam, diagnostics and the plan. I have also reviewed and agree with the medications, allergies and past medical history sections for this patient.

## 2021-10-17 NOTE — Progress Notes (Signed)
PODIATRY CLINIC NOTE     HPI  Jonathan [name] is a 51 year old  male who presents to clinic for follow up of left foot midfoot pain. He was seen last in October of 2022, and he was noted to have a high arched foot with midfoot arthritis. He was prescribed Voltaren gel and since has been doing well. He is currently in no pain and has not required use of the gel to the foot. No other pedal complaints. Denies constitutional symptoms.       ROS: Per HPI above, all systems were reviewed and are negative.     OBJECTIVE:  General: AAOx3. NAD. Pleasant. Non toxic appearance.   Lower extremity exam: Pedal pulses palpable, sensations intact.  No open lesions or areas of erythema or ecchymosis.  There is no tenderness upon palpation or manipulation throughout the foot and ankle bilaterally.  There is some bony hypertrophy overlying the dorsal midfoot of the left.  Ankle, subtalar, midtarsal range of motion is supple without any notable pain or crepitus.  With the patient in seated position and foot hanging, we can appreciated pes cavus foot type bilaterally.  Presents ambulate dependently in conventional shoe gears.    RADIOLOGY:  XR left foot 3 views (07/18/2021): No cortical interruptions or soft tissue deficits are noted.  There is some narrowing of joint spaces across the midfoot.  There is an increase in calcaneal inclination with a high arch morphology.  Otherwise unremarkable findings.    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  Patient is overall doing well, and Voltaren gel has given him pain relief.  Radiographs from previous visit were reviewed with the patient. we do recommend patient to continue with supportive shoe gears and activity modifications as tolerated.  Otherwise a refill on Voltaren gel was given to the patient, and to be applied as needed with pain.  If he continues to have pain despite these measures patient  should return back to our clinic.    - RTC as needed    Grace Isaac PGY 3

## 2021-10-24 ENCOUNTER — Other Ambulatory Visit (HOSPITAL_BASED_OUTPATIENT_CLINIC_OR_DEPARTMENT_OTHER): Payer: Self-pay | Admitting: Medical

## 2021-10-24 ENCOUNTER — Encounter (HOSPITAL_BASED_OUTPATIENT_CLINIC_OR_DEPARTMENT_OTHER): Payer: Self-pay | Admitting: Medical

## 2021-10-24 DIAGNOSIS — H0102A Squamous blepharitis right eye, upper and lower eyelids: Secondary | ICD-10-CM

## 2021-10-24 MED ORDER — ERYTHROMYCIN 5 MG/GM OP OINT
TOPICAL_OINTMENT | OPHTHALMIC | 0 refills | Status: AC
Start: 2021-10-24 — End: 2021-11-23

## 2021-10-24 MED FILL — ERYTHROMYCIN OIN OP: 7 days supply | Qty: 4 | Fill #1 | Status: CP

## 2021-10-24 NOTE — Telephone Encounter (Signed)
New rx 10/24/21

## 2021-10-24 NOTE — Telephone Encounter (Signed)
PER Patient (self), Jonathan [name] is a 51 year old male has requested a refill of erythromycin [facility]) ophthalmic ointment      Last Office Visit: 10/17/2021 with Schneider,H  Last Physical Exam: 03/09/2015    There are no preventive care reminders to display for this patient.    Other Med Adult:  Most Recent BP Reading(s)  09/23/21 : 130/75        Cholesterol (mg/dL)   Date Value   88/07/314 248 (H)     LOW DENSITY LIPOPROTEIN DIRECT (mg/dL)   Date Value   94/58/5929 176     HIGH DENSITY LIPOPROTEIN (mg/dL)   Date Value   24/46/2863 37 (L)     TRIGLYCERIDES (mg/dL)   Date Value   81/77/1165 247 (H)         THYROID SCREEN TSH REFLEX FT4 (uIU/mL)   Date Value   12/21/2015 0.510         No results found for: TSH    HEMOGLOBIN A1C (%)   Date Value   05/09/2021 5.6       No results found for: POCA1C      No results found for: INR    SODIUM (mmol/L)   Date Value   05/09/2021 144       POTASSIUM (mmol/L)   Date Value   05/09/2021 4.3           CREATININE (mg/dL)   Date Value   79/11/8331 0.8       Documented patient preferred pharmacies:    [facility], [place], [place] - [address]  Phone: [phone] Fax: [phone]

## 2021-11-17 ENCOUNTER — Encounter (HOSPITAL_BASED_OUTPATIENT_CLINIC_OR_DEPARTMENT_OTHER): Payer: Self-pay | Admitting: Medical

## 2021-11-17 DIAGNOSIS — H0102A Squamous blepharitis right eye, upper and lower eyelids: Secondary | ICD-10-CM

## 2021-11-18 NOTE — Telephone Encounter (Signed)
PER Patient (self), Jonathan [name] is a 51 year old male has requested a refill of erythromycin [facility]) ophthalmic ointment           Documented patient preferred pharmacies:        [facility], [place] - [address]  Phone: [phone] Fax: [phone]

## 2021-11-21 ENCOUNTER — Encounter (HOSPITAL_BASED_OUTPATIENT_CLINIC_OR_DEPARTMENT_OTHER): Payer: Self-pay | Admitting: Medical

## 2021-11-21 DIAGNOSIS — H0102A Squamous blepharitis right eye, upper and lower eyelids: Secondary | ICD-10-CM

## 2021-11-22 ENCOUNTER — Telehealth (HOSPITAL_BASED_OUTPATIENT_CLINIC_OR_DEPARTMENT_OTHER): Payer: Self-pay

## 2021-11-22 NOTE — Telephone Encounter (Signed)
Unable to reach patient-called patient to offer an appt if he is having eye concerns?

## 2021-11-24 NOTE — Telephone Encounter (Signed)
Patient called back and reached central refill     Can you please outreach patient     Thank you

## 2021-12-15 ENCOUNTER — Ambulatory Visit: Payer: No Typology Code available for payment source | Attending: Family Medicine | Admitting: Family Medicine

## 2021-12-15 DIAGNOSIS — H539 Unspecified visual disturbance: Secondary | ICD-10-CM | POA: Insufficient documentation

## 2021-12-15 DIAGNOSIS — H04123 Dry eye syndrome of bilateral lacrimal glands: Secondary | ICD-10-CM | POA: Diagnosis present

## 2021-12-15 MED ORDER — HYPROMELLOSE (GONIOSCOPIC) 2.5 % OP SOLN
1.0000 [drp] | Freq: Four times a day (QID) | OPHTHALMIC | 0 refills | Status: DC | PRN
Start: 2021-12-15 — End: 2021-12-15

## 2021-12-15 MED ORDER — HYPROMELLOSE (GONIOSCOPIC) 2.5 % OP SOLN
1.0000 [drp] | Freq: Four times a day (QID) | OPHTHALMIC | 0 refills | Status: DC | PRN
Start: 2021-12-15 — End: 2022-05-01

## 2021-12-15 NOTE — Progress Notes (Signed)
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  No current facility-administered medications on file prior to visit.       Review of Patient's Allergies indicates:  No Known Allergies     Social History    Tobacco Use      Smoking status: Never      Smokeless tobacco: Never    Alcohol use: No    Drug use: No         Exam - vitals deferred in setting of COVID pandemic  Speaking in complete sentences, breathing comfortably       Assessment/Plan:  (H53.9) Vision abnormalities  (primary encounter diagnosis)  Comment:placed  Plan: REFERRAL TO OPHTHALMOLOGY (INT)            (H04.123) Dry eyes  Comment:stop all eye washes for now for blepharitis  avoida ll drops for now   Plan: hydroxypropyl methylcellulose (ISOPTO TEARS)         2.5 % ophthalmic solution                I spent a total of 40 minutes on this visit on the date of  service (total time includes all activities performed on the date of service)        Zack Seal, MD, 12/15/2021

## 2021-12-20 ENCOUNTER — Encounter (HOSPITAL_BASED_OUTPATIENT_CLINIC_OR_DEPARTMENT_OTHER): Payer: Self-pay | Admitting: Family Medicine

## 2021-12-20 DIAGNOSIS — H04123 Dry eye syndrome of bilateral lacrimal glands: Secondary | ICD-10-CM

## 2021-12-20 MED ORDER — POLYVINYL ALCOHOL 1.4 % OP SOLN
1.0000 [drp] | Freq: Four times a day (QID) | OPHTHALMIC | 11 refills | Status: DC | PRN
Start: 2021-12-20 — End: 2022-08-20

## 2021-12-20 MED FILL — POLYVINYL AL SOL 1.4% OP: 30 days supply | Qty: 15 | Fill #0

## 2021-12-20 NOTE — Telephone Encounter (Signed)
Hello,     Please note: hydroxypropyl methylcellulose (ISOPTO TEARS) 2.5 % ophthalmic solution or its equivalent are not is available to order due to unavailability, if appropriate please send an alternative.     Artificial tears is available and covered by patients plan

## 2021-12-20 NOTE — Telephone Encounter (Signed)
Pended prescription for artificial tears attached

## 2022-04-07 ENCOUNTER — Ambulatory Visit (HOSPITAL_BASED_OUTPATIENT_CLINIC_OR_DEPARTMENT_OTHER): Payer: Self-pay | Admitting: Clinic/Center

## 2022-04-07 NOTE — Telephone Encounter (Signed)
Call via [place] telephone interpreter  Unable to reach the patient  Telephone voice message left with the clinic number to call

## 2022-04-07 NOTE — Telephone Encounter (Signed)
-----   Message from [facility] [place], [place] sent at 04/07/2022 10:31 AM EDT -----  Jonathan [name] [id number], 51 year old, male    Calls today:  Sick    What are the symptoms headache   How long has patient been sick? X2-3 weeks  What has pt. tried at home Anytime      Person calling on behalf of patient: Patient (self)    CALL BACK NUMBER: [phone]  Best time to call back: Anytime  Cell phone:   Other phone:    Patient's language of care: [place] ([place]    Patient needs a [place] interpreter.    Patient's PCP: Zack Seal, MD    Primary Care Home Site:  [facility]

## 2022-04-10 ENCOUNTER — Encounter (HOSPITAL_BASED_OUTPATIENT_CLINIC_OR_DEPARTMENT_OTHER): Payer: Self-pay

## 2022-04-10 ENCOUNTER — Ambulatory Visit (HOSPITAL_BASED_OUTPATIENT_CLINIC_OR_DEPARTMENT_OTHER): Payer: Self-pay

## 2022-04-10 NOTE — Telephone Encounter (Signed)
Reason for Disposition  . Intermittent fever > 100.4 F persists > 3 weeks    Answer Assessment - Initial Assessment Questions  blasheritis bilat eyes over a year. Seen in March, uses drops and ointment at night.   Red and dry eyes but not swollen.   "Feeling bad in general"  Sometimes febrile, mostly feels warm. Unsure max T today.   "head gets really hot especially in close environments".  Lots of HA's. Wakes up w/ HA x 3-4 weeks.  Experiencing some sleep difficulties.    Protocols used: ADULT FEVER-A-OH    Advised per nursing triage protocol.  Verbalized understanding and agreement with instructions and disposition.     Recommended disposition for patient:Disposition: See in Office within 3 days    If patient referred to UC/ED advised that they may require further follow up and testing after the visit with their primary care office.     Instructed patient to call back for any new, worsening, or worrisome symptoms or concerns any time day or night.

## 2022-04-10 NOTE — Telephone Encounter (Signed)
Regarding: fever, headaches, hot flashes,  ----- Message from Philipp Deputy sent at 04/10/2022 11:09 AM EDT -----  Jonathan [name] [id number], 51 year old, male    Calls today:  Sick    What are the symptoms fever, headaches, hot flashes,   How long has patient been sick? 3 weeks?  What has pt. tried at home advil 2-3 times a day      Person calling on behalf of patient: Patient (self)    CALL BACK NUMBER: [phone]      Patient's language of care: [place] ([place])    Patient needs a [place] interpreter.    Patient's PCP: Zack Seal, MD    Primary Care Home Site:  [facility]

## 2022-04-11 ENCOUNTER — Emergency Department (HOSPITAL_BASED_OUTPATIENT_CLINIC_OR_DEPARTMENT_OTHER): Payer: No Typology Code available for payment source

## 2022-04-11 ENCOUNTER — Ambulatory Visit (HOSPITAL_BASED_OUTPATIENT_CLINIC_OR_DEPARTMENT_OTHER): Payer: No Typology Code available for payment source | Admitting: Internal Medicine

## 2022-04-11 ENCOUNTER — Other Ambulatory Visit: Payer: Self-pay

## 2022-04-11 ENCOUNTER — Emergency Department
Admission: EM | Admit: 2022-04-11 | Discharge: 2022-04-11 | Disposition: A | Discharge status: Home / Self Care | Payer: No Typology Code available for payment source | Attending: Emergency Medicine | Admitting: Emergency Medicine

## 2022-04-11 ENCOUNTER — Encounter (HOSPITAL_BASED_OUTPATIENT_CLINIC_OR_DEPARTMENT_OTHER): Payer: Self-pay

## 2022-04-11 DIAGNOSIS — R0789 Other chest pain: Secondary | ICD-10-CM | POA: Diagnosis not present

## 2022-04-11 DIAGNOSIS — R079 Chest pain, unspecified: Secondary | ICD-10-CM | POA: Diagnosis not present

## 2022-04-11 LAB — CBC, PLATELET & DIFFERENTIAL
ABSOLUTE BASO COUNT: 0 10*3/uL (ref 0.0–0.1)
ABSOLUTE EOSINOPHIL COUNT: 0.2 10*3/uL (ref 0.0–0.8)
ABSOLUTE IMM GRAN COUNT: 0.01 10*3/uL (ref 0.00–0.10)
ABSOLUTE LYMPH COUNT: 2.4 10*3/uL (ref 0.6–5.9)
ABSOLUTE MONO COUNT: 0.5 10*3/uL (ref 0.2–1.4)
ABSOLUTE NEUTROPHIL COUNT: 3.8 10*3/uL (ref 1.6–8.3)
ABSOLUTE NRBC COUNT: 0 10*3/uL (ref 0.0–0.0)
BASOPHIL %: 0.6 % (ref 0.0–1.2)
EOSINOPHIL %: 2.4 % (ref 0.0–7.0)
HEMATOCRIT: 45.1 % (ref 40.1–51.0)
HEMOGLOBIN: 15.4 g/dL (ref 13.7–17.5)
IMMATURE GRANULOCYTE %: 0.1 % (ref 0.0–1.0)
LYMPHOCYTE %: 34.8 % (ref 15.0–54.0)
MEAN CORP HGB CONC: 34.1 g/dL (ref 31.0–37.0)
MEAN CORPUSCULAR HGB: 28.8 pg (ref 26.0–34.0)
MEAN CORPUSCULAR VOL: 84.5 fl (ref 80.0–100.0)
MEAN PLATELET VOLUME: 10.8 fL (ref 8.7–12.5)
MONOCYTE %: 6.9 % (ref 4.0–13.0)
NEUTROPHIL %: 55.2 % (ref 40.0–75.0)
NRBC %: 0 % (ref 0.0–0.0)
PLATELET COUNT: 227 10*3/uL (ref 150–400)
RBC DISTRIBUTION WIDTH STD DEV: 38 fL (ref 35.1–46.3)
RED BLOOD CELL COUNT: 5.34 M/uL (ref 4.60–6.10)
WHITE BLOOD CELL COUNT: 7 10*3/uL (ref 4.0–11.0)

## 2022-04-11 LAB — TROPONIN T HS BASELINE: TROPONIN T HS BASELINE: 6 ng/L (ref 0–15)

## 2022-04-11 LAB — BASIC METABOLIC PANEL
ANION GAP: 12 mmol/L (ref 10–22)
BUN (UREA NITROGEN): 21 mg/dL — ABNORMAL HIGH (ref 7–18)
CALCIUM: 9.3 mg/dL (ref 8.5–10.5)
CARBON DIOXIDE: 22 mmol/L (ref 21–32)
CHLORIDE: 106 mmol/L (ref 98–107)
CREATININE: 0.8 mg/dL (ref 0.7–1.2)
ESTIMATED GLOMERULAR FILT RATE: >60 mL/min (ref >60)
Glucose Random: 98 mg/dL (ref 74–160)
POTASSIUM: 4.2 mmol/L (ref 3.5–5.1)
SODIUM: 140 mmol/L (ref 136–145)

## 2022-04-11 LAB — TROPONIN T HS 1 HOUR: TROPONIN T HS 1 HOUR RESULT: <6 ng/L (ref 0–15)

## 2022-04-11 NOTE — ED Provider Notes (Signed)
eMERGENCY dEPARTMENT Physician NOTE    The ED nursing record was reviewed.   The prior medical records as available electronically through Epic were reviewed.  The mode of arrival was Self     Care language: [place]); Interpreter declined: patient proficient in [place].       CHIEF COMPLAINT    Patient presents with:  Chest Pain      HPI    Jonathan [name] is a 51 year old male history of anxiety, dyslipidemia who presents for chest pain that started last night.  Patient reports that he noted left-sided chest pain that was nonradiating worse with movement particular reaching for certain objects.  He does report that with this chest pain he had intermittent shortness of breath that he describes as a sensation that he is unable to get a full breath.  This is also has been intermittent.  The shortness of breath resolved however does report constant left-sided chest pain.  Pain does not worsen with exertion.      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  05/12/2021: Migraine with aura and without status migrainosus, not   intractable  11/21/2019: Pterygium of left eye  10/05/2020: Squamous blepharitis of upper and lower eyelids of both   eyes    PROBLEM LIST  Patient Active Problem List:     Headache disorder     Other and unspecified hyperlipidemia     Allergic rhinitis, cause unspecified     Family history of stroke (cerebrovascular)     Sciatica     Esophageal reflux     Irritable bowel syndrome (IBS)     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    Current Outpatient Medications:     polyvinyl alcohol (ARTIFICIAL TEARS) 1.4 % ophthalmic solution, Place 1 drop into both eyes 4 (four) times daily as needed (dry eyes), Disp: 15 mL, Rfl: 11    hydroxypropyl methylcellulose (ISOPTO TEARS) 2.5 % ophthalmic solution, Place 1 drop into both eyes 4 (four) times daily as needed for Dry eyes, Disp: 15 mL, Rfl: 0    ALLERGIES    Review of Patient's Allergies indicates:  No Known Allergies    FAMILY HISTORY    Review of patient's family history indicates:  Problem: Stroke      Relation: Father          Age of Onset: (Not Specified)          Comment: 29  Problem: GI      Relation: Mother          Age of Onset: (Not Specified)          Comment: died 32 after ?GI surgery  Problem: Blood Disease  Relation: Brother          Age of Onset: (Not Specified)          Comment: not sure what       SOCIAL HISTORY    Social History     Socioeconomic History    Marital status: Single     Spouse name: Not on file    Number of children: Not on file    Years of education: Not on file    Highest education level: Not on file   Occupational History    Occupation: cook     Employer: [name]     Comment: In [place] 2002; 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, family   Tobacco Use    Smoking status: Never    Smokeless tobacco: Never   Substance and Sexual Activity    Alcohol use: No    Drug use: No    Sexual activity: Never     Comment: not sexually active   Other Topics Concern    Not on file   Social History Narrative    Working: Investment banker, 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 All other systems were reviewed and are negative.    PHYSICAL EXAM      BP 125/82   Pulse 88   Temp 98.2 F (Temporal)   Resp 16   SpO2 99%   GENERAL: Well-appearing, no distress, non-toxic   HEENT: Atraumatic, PERRL. EOMI. Normal conjunctiva  Oropharynx clear.  NECK:   Supple with full painless ROM at the neck  LUNGS: Clear to auscultation bilaterally without rales, rhonchi or wheezing.   HEART: Point tenderness over the left chest wall regular rate and rhythm.  No murmurs, rubs, or gallops.  Equal pulses bilaterally  ABDOMEN:  Soft, flat, without distension.  Nontender to palpation.   SKIN:  Warm & Dry, no rash, no bruising.    RESULTS  Results for orders placed or performed during the hospital encounter of 04/11/22 (from the past 24 hour(s))   CBC, Platelet & Differential    Collection Time: 04/11/22 10:42 AM   Result Value    WHITE BLOOD CELL COUNT 7.0    RED BLOOD CELL COUNT 5.34    HEMOGLOBIN 15.4    HEMATOCRIT 45.1    MEAN CORPUSCULAR VOL 84.5    MEAN CORPUSCULAR HGB 28.8    MEAN CORP HGB CONC 34.1    RBC DISTRIBUTION WIDTH STD DEV 38.0    PLATELET COUNT 227    MEAN PLATELET VOLUME 10.8    NEUTROPHIL % 55.2    IMMATURE GRANULOCYTE % 0.1    LYMPHOCYTE % 34.8    MONOCYTE % 6.9    EOSINOPHIL % 2.4    BASOPHIL % 0.6    NRBC % 0.0    ABSOLUTE NEUTROPHIL COUNT 3.8  ABSOLUTE IMM GRAN COUNT 0.01    ABSOLUTE LYMPH COUNT 2.4    ABSOLUTE MONO COUNT 0.5    ABSOLUTE EOSINOPHIL COUNT 0.2    ABSOLUTE BASO COUNT 0.0    ABSOLUTE NRBC COUNT 0.0   Basic Metabolic Panel    Collection Time: 04/11/22 10:42 AM   Result Value    SODIUM 140    POTASSIUM 4.2    CHLORIDE 106    CARBON DIOXIDE 22    ANION GAP 12    CALCIUM 9.3    Glucose Random 98    BUN (UREA NITROGEN) 21 (H)     CREATININE 0.8    ESTIMATED GLOMERULAR FILT RATE > 60   Troponin T HS Baseline    Collection Time: 04/11/22 10:42 AM   Result Value    TROPONIN T HS BASELINE 6   Troponin T HS 1 Hour    Collection Time: 04/11/22 11:35 AM   Result Value    TROPONIN T HS 1 HOUR RESULT < 6    DELTA 1 HOUR TROPONIN T HS TNP        RADIOLOGY        MEDICATIONS ADMINISTERED ON THIS VISIT  No orders of the defined types were placed in this encounter.        ED COURSE & MEDICAL DECISION MAKING      51 year old male presents for left-sided chest pain.  Well-appearing and in no acute distress.  Hemodynamically stable.  EKG obtained shows no signs of acute ischemia, troponin negative x2.  Heart score is 3, low risk ACS at this time.  No PE risk factors. Low suspicion for dissection . No muffled heart sounds or evidence of tachycardia or electrical alternans on EKG. Likely musculoskeletal.  Will be discharged    HEART Score Analysis for Low Risk Chest pain from the ED (Six, A J, B E Backus, and J C  Kelder. 2008. Chest pain in the emergency room: value of the HEART score. [place] heart  journal?: monthly journal of the [place] Society of Cardiology and the [place] Heart  Foundation, no. 6. GreatestFeeling.no.)  Emergency Department HEART Score Chest Pain Risk Stratification     History: Patient Score: 1              2: Highly Suspicious ED History              1: Somewhat Suspicious ED History              0: Non-suspicious ED History  ECG: Patient Score: 0              2: ST Depressions              1: Early repolarization abnormalities              0: Normal ECG  Age: Patient Score: 1              68: > 22 years old              13: 21-61 years old              58: <16 years old  Risk Factors: Patient Score: 1              2: 3 risk factors, or history of CAD              1: 1-2 risk factors              0:  No risk factors  Troponin: Patient Score: 0              2: Value > 3x normal limit              1:  Value 1-3x normal limit              0: Value within or less than normal limit                                                                TOTAL SCORE:      Total Score Interpretation     0-3: Low Risk, 1.7% chance of MACE* in next 6 weeks  4-6: Moderate Risk, 16.6% chance of MACE* in next 6 weeks  7-10: High Risk, 50.1% chance of MACE* in next 6 weeks     MACE = Major Adverse Cardiac Event: AMI, PCI, CABG, positive angiography        I reviewed the patient's past medical history/problem list, past surgical history, medication list, social history and allergies. Pt remained hemodynamically stable during their stay in the emergency department.       Condition: Stable  Disposition: Home      Diagnosis:   Chest pain, unspecified type    Duke Salvia. Amahd Morino, MD   Site Chief, Emergency Department   [facility]

## 2022-04-11 NOTE — ED Triage Note (Signed)
Pt presents to ED with c/o chest pain and SOB that started last night. Took ibuprofen before falling asleep and woke up with pain that didn't improve. Denies N/V/D. " I feel hot and I am sweating, but I biked here". Left sided chest pain that does not radiate. Speaking in full sentences, A&o x3.

## 2022-04-11 NOTE — Narrator Note (Signed)
Patient Disposition  Patient education for diagnosis, medications, activity, diet and follow-up.  Patient left ED 12:46 PM.  Patient rep received written instructions.    Interpreter to provide instructions: Yes; Interpreter ID: [place]    Patient belongings with patient: YES    Have all existing LDAs been addressed? Yes    Have all IV infusions been stopped? Yes    Destination: Discharged to home. Reviewed d/c instructions including follow up care and scripts. Pt verbalized understanding. Pt left ED via ambulating w steady gait.

## 2022-04-11 NOTE — Discharge Instructions (Addendum)
To make follow up appointment with your primary care doctor or schedule an appointment with  a primary provider from the physician list/number provided to you within 1 day of discharge.    Physician instructions:  In addition to my verbal instructions regarding follow up and return to ER instructions,  the patient should return within 12-24 hours if their symptoms are not resolving,  worsening or they have new or concerning symptoms.

## 2022-04-13 LAB — EKG

## 2022-05-01 ENCOUNTER — Ambulatory Visit: Payer: No Typology Code available for payment source | Attending: Family Medicine | Admitting: Medical

## 2022-05-01 ENCOUNTER — Encounter (HOSPITAL_BASED_OUTPATIENT_CLINIC_OR_DEPARTMENT_OTHER): Payer: Self-pay | Admitting: Medical

## 2022-05-01 ENCOUNTER — Other Ambulatory Visit: Payer: Self-pay

## 2022-05-01 VITALS — BP 128/86 | HR 76 | Temp 98.7°F | Ht 65.0 in | Wt 151.8 lb

## 2022-05-01 DIAGNOSIS — H0102B Squamous blepharitis left eye, upper and lower eyelids: Secondary | ICD-10-CM | POA: Diagnosis present

## 2022-05-01 DIAGNOSIS — K219 Gastro-esophageal reflux disease without esophagitis: Secondary | ICD-10-CM | POA: Insufficient documentation

## 2022-05-01 DIAGNOSIS — H0102A Squamous blepharitis right eye, upper and lower eyelids: Secondary | ICD-10-CM | POA: Diagnosis present

## 2022-05-01 DIAGNOSIS — H04123 Dry eye syndrome of bilateral lacrimal glands: Secondary | ICD-10-CM | POA: Insufficient documentation

## 2022-05-01 MED ORDER — HYPROMELLOSE (GONIOSCOPIC) 2.5 % OP SOLN
1.0000 [drp] | Freq: Four times a day (QID) | OPHTHALMIC | 0 refills | Status: DC | PRN
Start: 2022-05-01 — End: 2022-08-20

## 2022-05-01 NOTE — Patient Instructions (Signed)
Specialty Scheduling Center: [phone]

## 2022-05-01 NOTE — Progress Notes (Unsigned)
[facility]  Office Visit Note   Subjective:   CC: Sylvio Malacara is a 51 year old male patient who presents today for eye redness     Telephone interpreter services used for visit     #red eyes  - burning and itching   - gets headaches in band pattern  - has seen optho - blepharitis 2019    - some pain on R side of head - where the tip of glasses   - using erythro and tobradex and [facility] for red eyes prn   tobradex prescribed by [place] dr in [place]   - no itchy throat or itchy nose   - sometimes rinses eyes with water     #stomach  Felt hot/ feverish and vomiting and diarrhea - yesterday - feels like that is due to blepharitis   Works in [employer] - the heat hurts his face and eyes   Feels like stomach is crunched - was about to vomit but didn't come up   Went to ED last week for CP - EKG normal   - eating a lot of pasta and using a lot of advil for headaches     Do you want to become pregnant or a parent in the next 12 months?: N/A  Would you like more information about contraceptive options today?: No  Current Method:: No contraceptive precautions  Desired Method:: No contraceptive precautions  Contraceptive Information provided in rooming: No  Provider action/counseling needed on selected desired methods: No action needed    The provider reviewed the patient's pregnancy intentions and contraceptive needs. Discussed potential contraceptive methods.      ROS:  Per HPI   Objective:   BP 128/86   Pulse 76   Temp 98.7 F (37.1 C) (Temporal)   Ht 5\' 5"  (1.651 m)   Wt 68.9 kg (151 lb 12.8 oz)   BMI 25.26 kg/m   Pain Score: Data Unavailable    Physical Exam:  General: alert, appears stated age and cooperative  Eyes: EOMI, Conjunctiva Clear  GI: {EXAM; ABDOMEN:13879::"soft, non-tender. Bowel sounds normal. No masses,  no organomegaly"}  MS: {MUSCULOSKELETAL EXAM:803::"Spine ROM normal. Muscular strength intact."}   Skin: {SKIN EXAM:101::"No rashes or lesions","skin color, texture,  turgor are normal"}  Psych: {MENTAL STATUS:11582::"Normal Mood","interactive"}   Neuro: {EXAM; NEURO:13891::"Alert and oriented X 3. Strength grossly intact. Normal coordination and gait."}    Assessment & Plan:         I spent a total of 30 minutes on this visit on the date of service (total time includes all activities performed on the date of service)    We discussed the patient's current medications. The patient expressed understanding and no barriers to adherence were identified.  The patient was given an AVS  , PA-C 05/01/2022

## 2022-05-02 MED FILL — POLYVINYL AL SOL 1.4% OP: 30 days supply | Qty: 15 | Fill #1 | Status: CP

## 2022-06-22 ENCOUNTER — Ambulatory Visit: Payer: No Typology Code available for payment source | Attending: Ophthalmology | Admitting: Ophthalmology

## 2022-06-22 ENCOUNTER — Encounter (HOSPITAL_BASED_OUTPATIENT_CLINIC_OR_DEPARTMENT_OTHER): Payer: Self-pay | Admitting: Ophthalmology

## 2022-06-22 ENCOUNTER — Other Ambulatory Visit: Payer: Self-pay

## 2022-06-22 DIAGNOSIS — H524 Presbyopia: Secondary | ICD-10-CM | POA: Insufficient documentation

## 2022-06-22 DIAGNOSIS — H5203 Hypermetropia, bilateral: Secondary | ICD-10-CM | POA: Diagnosis present

## 2022-06-22 DIAGNOSIS — H0102B Squamous blepharitis left eye, upper and lower eyelids: Secondary | ICD-10-CM | POA: Insufficient documentation

## 2022-06-22 DIAGNOSIS — R519 Headache, unspecified: Secondary | ICD-10-CM | POA: Diagnosis present

## 2022-06-22 DIAGNOSIS — H52203 Unspecified astigmatism, bilateral: Secondary | ICD-10-CM | POA: Diagnosis present

## 2022-06-22 DIAGNOSIS — H0102A Squamous blepharitis right eye, upper and lower eyelids: Secondary | ICD-10-CM | POA: Diagnosis present

## 2022-06-22 DIAGNOSIS — H04129 Dry eye syndrome of unspecified lacrimal gland: Secondary | ICD-10-CM | POA: Diagnosis present

## 2022-06-22 MED ORDER — CARBOXYMETHYLCELLULOSE SODIUM 0.5 % OP SOLN
1.00 [drp] | Freq: Four times a day (QID) | OPHTHALMIC | 12 refills | Status: AC
Start: 2022-06-22 — End: 2024-06-21

## 2022-06-22 MED ORDER — ERYTHROMYCIN 5 MG/GM OP OINT
TOPICAL_OINTMENT | OPHTHALMIC | 12 refills | Status: DC
Start: 2022-06-22 — End: 2022-08-20

## 2022-06-22 NOTE — Progress Notes (Signed)
51 years old male here for annual eye exam,  H/O S/P Pterygium excision with mmc and graft OS(12/17/2019),Hyperopia,Astigamtsim,Presbyopia,Blepharitis               C/O Headache right side of head for 2 months.  Eye pain off/on OU.  No cold or coughing.  Stable vision .  No light flashes or floaters.  No h/o eye or head trauma.            Ocular meds   At's PRN OU  Erythromycin QHS OU LD 11.30 pm last night

## 2022-06-22 NOTE — Progress Notes (Signed)
Patient presents with:  Headache: Headaches, behind right ear, and back of head, daily, relieved with Advil. For comprehensive eye exam. He is complaining of frequent headaches.  There is no ophthalmic cause or clue to the reasons for headaches after a complete ophthalmic examination, including dilated examination of the optic nerve or retina.  Specifically, there is no optic nerve head swelling (papilledema).  He will follow up with his PCP if the headaches persist, become more frequent or more severe, or he develops any other neurological symptoms or signs.     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 No recurrence. Avoid UV, use sunglasses.    Itchy Eyes: His eyes continue to be itching and dry.  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   He is instructed to apply warm compresses to his closed eyelids twice a day, for five minutes at a time.  He will clean his eyelid margins gently, with diluted baby shampoo every evening.  He is encouraged to apply Ilotycin ointment inside both eyelids every evening for at least one month.    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  He will use artificial tears in both eyes, 4-6 times a day.

## 2022-06-22 NOTE — Patient Instructions (Signed)
Jonathan [name]    You have a common, and mild condition known as blepharitis.  It could be thought of as analogous to a dandruff of the eye lashes.  There is an inflammation and irritation of your eyelid margins in both eyes.  A flaky, but oily skin at the base of the eyelashes causes inflammation.  The normal bacteria on the skin overgrow.  This sheds into your eyes, causing your eyes to be itchy, swollen and red.  You should:  1) Soak your closed eyelids four five minutes at a time with a warm face clothe every day.  2) Clean the eyelid margins with diluted baby shampoo daily.  Use a teaspoon of baby shampoo in a glass of warm water, then dip a face clothe into this, and use to gently clean the lid margins once a day.  3) Apply the Ilotycin ointment which I prescribed for you, every evening, inside the eyelid and on the lid margin, once a day for a month.    Gershon Crane, MD  [phone]

## 2022-08-20 ENCOUNTER — Emergency Department
Admission: EM | Admit: 2022-08-20 | Discharge: 2022-08-21 | Disposition: A | Discharge status: Home / Self Care | Payer: No Typology Code available for payment source | Attending: Emergency Medicine | Admitting: Emergency Medicine

## 2022-08-20 ENCOUNTER — Emergency Department (HOSPITAL_BASED_OUTPATIENT_CLINIC_OR_DEPARTMENT_OTHER): Payer: No Typology Code available for payment source

## 2022-08-20 ENCOUNTER — Other Ambulatory Visit: Payer: Self-pay

## 2022-08-20 DIAGNOSIS — W108XXA Fall (on) (from) other stairs and steps, initial encounter: Secondary | ICD-10-CM | POA: Diagnosis not present

## 2022-08-20 DIAGNOSIS — S3993XA Unspecified injury of pelvis, initial encounter: Secondary | ICD-10-CM | POA: Diagnosis not present

## 2022-08-20 DIAGNOSIS — S79911A Unspecified injury of right hip, initial encounter: Secondary | ICD-10-CM | POA: Diagnosis not present

## 2022-08-20 DIAGNOSIS — R0781 Pleurodynia: Secondary | ICD-10-CM | POA: Diagnosis not present

## 2022-08-20 DIAGNOSIS — Y9389 Activity, other specified: Secondary | ICD-10-CM | POA: Insufficient documentation

## 2022-08-20 DIAGNOSIS — M545 Low back pain, unspecified: Secondary | ICD-10-CM

## 2022-08-20 DIAGNOSIS — S29001A Unspecified injury of muscle and tendon of front wall of thorax, initial encounter: Secondary | ICD-10-CM | POA: Insufficient documentation

## 2022-08-20 DIAGNOSIS — S300XXA Contusion of lower back and pelvis, initial encounter: Secondary | ICD-10-CM

## 2022-08-20 DIAGNOSIS — Y929 Unspecified place or not applicable: Secondary | ICD-10-CM | POA: Insufficient documentation

## 2022-08-20 DIAGNOSIS — Y999 Unspecified external cause status: Secondary | ICD-10-CM | POA: Diagnosis not present

## 2022-08-20 DIAGNOSIS — W19XXXA Unspecified fall, initial encounter: Secondary | ICD-10-CM | POA: Diagnosis not present

## 2022-08-20 MED ORDER — ACETAMINOPHEN 325 MG PO TABS
650.00 mg | ORAL_TABLET | Freq: Once | ORAL | Status: AC
Start: 2022-08-20 — End: 2022-08-20
  Administered 2022-08-20: 650 mg via ORAL
  Filled 2022-08-20: qty 2

## 2022-08-20 MED ORDER — IBUPROFEN 600 MG PO TABS
600.00 mg | ORAL_TABLET | Freq: Once | ORAL | Status: AC
Start: 2022-08-20 — End: 2022-08-20
  Administered 2022-08-20: 600 mg via ORAL
  Filled 2022-08-20: qty 1

## 2022-08-20 MED ORDER — ACETAMINOPHEN 325 MG PO TABS
650.00 mg | ORAL_TABLET | Freq: Four times a day (QID) | ORAL | 0 refills | Status: AC | PRN
Start: 2022-08-20 — End: 2022-09-19

## 2022-08-20 MED ORDER — LIDOCAINE 4 % EX PTCH
1.00 | MEDICATED_PATCH | Freq: Every day | CUTANEOUS | 0 refills | Status: AC | PRN
Start: 2022-08-20 — End: 2022-08-27

## 2022-08-20 MED ORDER — DIAZEPAM 5 MG PO TABS
5.00 mg | ORAL_TABLET | Freq: Once | ORAL | Status: AC
Start: 2022-08-21 — End: 2022-08-21
  Administered 2022-08-21: 5 mg via ORAL
  Filled 2022-08-20: qty 1

## 2022-08-20 MED ORDER — CYCLOBENZAPRINE HCL 10 MG PO TABS
10.00 mg | ORAL_TABLET | Freq: Once | ORAL | Status: AC
Start: 2022-08-20 — End: 2022-08-20
  Administered 2022-08-20: 10 mg via ORAL
  Filled 2022-08-20: qty 1

## 2022-08-20 MED ORDER — CYCLOBENZAPRINE HCL 10 MG PO TABS
10.00 mg | ORAL_TABLET | Freq: Three times a day (TID) | ORAL | 0 refills | Status: AC | PRN
Start: 2022-08-20 — End: 2022-08-27

## 2022-08-20 MED ORDER — IBUPROFEN 600 MG PO TABS
600.00 mg | ORAL_TABLET | Freq: Three times a day (TID) | ORAL | 0 refills | Status: AC | PRN
Start: 2022-08-20 — End: 2022-08-27

## 2022-08-20 NOTE — ED Provider Notes (Signed)
The patient was seen primarily by me. ED nursing record was reviewed. Prior records as available electronically through the Epic record were reviewed.  Care language: [place] ([place]); Interpreter declined: adult family member(s) interpreted at patient request.         HPI:    This is a 51 year old male patient complaining of slip and fall down 5 stairs while taking out trash, slipped on wet area, no head strike, no loss of conscious.  Complaining of right posterior rib pain and right posterior hip pain.  Denies taking medications prior to arrival.  Denies any numbness, weakness, changes in bowel or bladder habits.        ROS: Pertinent positives were reviewed as per the HPI above. All other systems were reviewed and are negative.      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  05/12/2021: Migraine with aura and without status migrainosus, not   intractable  11/21/2019: Pterygium of left eye  10/05/2020: Squamous blepharitis of upper and lower eyelids of both   eyes  Patient Active Problem List:     Headache disorder     Other and unspecified hyperlipidemia     Allergic rhinitis, cause unspecified     Family history of stroke (cerebrovascular)     Sciatica     Esophageal reflux     Irritable bowel syndrome (IBS)     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     Squamous blepharitis of upper and lower eyelids of both eyes     Migraine with aura and without status migrainosus, not intractable        Past Surgical History: Past Surgical History:  11/2003: EXCISION/TRANSPOSITION PTERYGIUM W/O GRAFT; Right      Comment:  s/p excision of pterygium OD by VJP II  12/17/2019: PTERYGIUM EXCISION W/ GRAFT; Left      Comment:  s/p pterygium excision, with mitomycin C and amniograft                OS by VJP II      Medications:   Current Facility-Administered Medications   Medication    acetaminophen  (TYLENOL) tablet 650 mg    cyclobenzaprine (FLEXERIL) tablet 10 mg    ibuprofen (ADVIL) tablet 600 mg     Current Outpatient Medications   Medication Sig    carboxymethylcellulose (REFRESH TEARS) 0.5 % SOLN Place 1 drop into both eyes in the morning and 1 drop at noon and 1 drop in the evening and 1 drop before bedtime.         Social History: Social History    Tobacco Use      Smoking status: Never      Smokeless tobacco: Never    Alcohol use: No        Allergies:  Review of Patient's Allergies indicates:  No Known Allergies      Physical Exam:  ED Triage Vitals [08/20/22 2059]   ED Triage Vitals Brief Group      Temp 98.4 F      Pulse 98      Resp 20      BP (!) 151/103      SpO2 98 %      Pain Score 10        GENERAL: No acute distress.   SKIN:  Warm & Dry, no rash.  HEAD: Atraumatic. PERRL. EOMI.  Oropharynx: clear.  NECK: No midline tenderness.  No LAN.   LUNGS:  Clear to auscultation bilaterally. No wheezes, rales, rhonchi.   HEART:  RRR.  No murmurs, rubs, or gallops.   ABDOMEN:  Soft, NTND.  No guarding or rebound tenderness.   MUSCULOSKELETAL:  No obvious deformities.  Abrasion to right forearm, right posterior thoracic back, mild tenderness over ribs 9/10.  Mild tenderness over iliac crest on right.  Range of motion to lower extremities.  2+ pulse DP, sensation intact throughout.  NEUROLOGIC: Alert and oriented.  Moves all extremities well.  PSYCHIATRIC:  Appropriate for age, time of day, and situation    Recent Lab Results:  Labs Reviewed - No data to display      ED Course and Medical Decision-making:    The patient is a  51 year old male with past medical history as above presents with fall down steps, patient concerned about possible fractures.  Tenderness over lower ribs and lumbosacral area.  X-rays performed of these areas.  X-rays unremarkable by my independent evaluation.  Likely contusion.  No focal neurodeficits.  Stable for discharge with outpatient follow-up and supportive management.      Additional verbal discharge instructions were provided including patients diagnosis and follow up plan, as well as reasons to return to the Emergency Department which were discussed in detail.  Patient is agreeable with this management.         Condition: Improved and Stable    Disposition:  Discharged to home    Diagnosis/Diagnoses:  No diagnosis found.        Cindie Laroche  Attending Physician  Emergency Department  [facility]  [phone]

## 2022-08-20 NOTE — ED Triage Note (Signed)
Patient was taking out garbage, and slipped and fell onto his back on outdoor steps (approx 5). No head strike or LOC, no blood thinners. No spinal tenderness, but large abrasion on thoracic area, very tender to palpation. No bruising noted. Neuros intact. Patient concerned for rib fracture. No obvious deformity or crepitus noted.

## 2022-08-21 NOTE — Narrator Note (Signed)
Patient Disposition  Patient education for diagnosis, medications, activity, diet and follow-up.  Patient left ED 12:05 AM.  Patient rep received written instructions.    Interpreter to provide instructions: No-family as interp    Patient belongings with patient: YES    Have all existing LDAs been addressed? N/A    Have all IV infusions been stopped? N/A    Destination: Discharged to home    Patient awake and ambulatory, discharged to home. Expressed understanding of discharge and follow up instructions; opportunity given for patient to ask questions.

## 2022-08-23 ENCOUNTER — Emergency Department
Admission: EM | Admit: 2022-08-23 | Discharge: 2022-08-23 | Disposition: A | Discharge status: Home / Self Care | Payer: No Typology Code available for payment source | Attending: Emergency Medicine | Admitting: Emergency Medicine

## 2022-08-23 ENCOUNTER — Other Ambulatory Visit: Payer: Self-pay

## 2022-08-23 DIAGNOSIS — Y9301 Activity, walking, marching and hiking: Secondary | ICD-10-CM | POA: Insufficient documentation

## 2022-08-23 DIAGNOSIS — Y92019 Unspecified place in single-family (private) house as the place of occurrence of the external cause: Secondary | ICD-10-CM | POA: Insufficient documentation

## 2022-08-23 DIAGNOSIS — M545 Low back pain, unspecified: Secondary | ICD-10-CM | POA: Insufficient documentation

## 2022-08-23 DIAGNOSIS — W109XXA Fall (on) (from) unspecified stairs and steps, initial encounter: Secondary | ICD-10-CM | POA: Insufficient documentation

## 2022-08-23 MED ORDER — ACETAMINOPHEN 500 MG PO TABS
1000.0000 mg | ORAL_TABLET | Freq: Once | ORAL | Status: AC
Start: 2022-08-23 — End: 2022-08-23
  Administered 2022-08-23: 1000 mg via ORAL
  Filled 2022-08-23: qty 2

## 2022-08-23 MED ORDER — ACETAMINOPHEN 500 MG PO TABS
500.00 mg | ORAL_TABLET | Freq: Three times a day (TID) | ORAL | 0 refills | Status: AC | PRN
Start: 2022-08-23 — End: 2022-08-30

## 2022-08-23 MED ORDER — KETOROLAC TROMETHAMINE 15 MG/ML IJ SOLN
15.0000 mg | Freq: Once | INTRAMUSCULAR | Status: AC
Start: 2022-08-23 — End: 2022-08-23
  Administered 2022-08-23: 15 mg via INTRAMUSCULAR
  Filled 2022-08-23: qty 1

## 2022-08-23 MED ORDER — IBUPROFEN 600 MG PO TABS
600.00 mg | ORAL_TABLET | Freq: Three times a day (TID) | ORAL | 0 refills | Status: AC | PRN
Start: 2022-08-23 — End: 2022-09-22

## 2022-08-23 NOTE — Narrator Note (Signed)
Patient Disposition  Patient education for diagnosis, medications, activity, diet and follow-up.  Patient left ED 3:18 PM.  Patient rep received written instructions.    Interpreter to provide instructions: No    Patient belongings with patient: YES    Have all existing LDAs been addressed? N/A    Have all IV infusions been stopped? N/A    Destination: Discharged to home    Ambulatory upon departure, verbalized understanding of discharge instructions.

## 2022-08-23 NOTE — Discharge Instructions (Signed)
Diagnosis and treatment:  - you or your loved one was seen at [facility] for back pain   - your evaluation is reassuring that it is safe for you to go home    Further care:  - you have no new concerning findings on exam but we are modifying your medications  - take medications as prescribed to treat your condition    New medications:  - ibuprofen - 600mg  every 6 hours  - tylenol - 500mg  every 6 hours    Call your primary care provider tomorrow:  - to let them know about your visit today  - to request a follow up appointment if needed   - if you do not have a primary care doctor or would like to transfer your care to [facility], call [phone] to set up an appointment    Go to the emergency department:  - for new or worsening symptoms including additional falls, tingling in your legs or genital area   - if you are unable to schedule follow up care

## 2022-08-23 NOTE — ED Triage Note (Signed)
Pt to triage c/o lower right back pain since Sunday.   Fell on Sunday down stairs outside his home. Seen in ED then and prescribed meds (ibuprofen, tylenol and cyclobenzaprine and arnica cream) states took meds for 3 days with no improvement.  Stabbing pain   Denies numbness or tingling   Denies loss of bowel/bladder  Reports been sleeping on stomach for comfort and feels better standing

## 2022-08-23 NOTE — ED Provider Notes (Signed)
ED nursing record was reviewed. Prior medical records as available through Epic were reviewed    Primary care language: [place]   A hospital interpreter was used for the history, physical exam and subsequent hospital course    This patient was seen with Emergency Department attending physician Dr. Glori Bickers    ED RN triage:   Triage Documentation       Jonathan Kanaris, RN 08/23/2022 14:12                 Pt to triage c/o lower right back pain since Sunday.   Fell on Sunday down stairs outside his home. Seen in ED then and prescribed meds (ibuprofen, tylenol and cyclobenzaprine and arnica cream) states took meds for 3 days with no improvement.  Stabbing pain   Denies numbness or tingling   Denies loss of bowel/bladder  Reports been sleeping on stomach for comfort and feels better standing           HPI:    Jonathan [name] is a 51 year old male with past medical history significant for gerd who presents at 34.    Presents for evaluation of R low back pain. Onset 3 days ago after falling down a few stairs. Seen in the ER that day, imaging obtained, he was ultimately discharged home. Treating with ibuprofen, tylenol, cyclobenzaprine. Here today because he continues to have a stabbing sensation in the R low back. Denies fevers, limb numbness/weakness, saddle anesthesia, bowel/bladder complaints. Denies chest/abdominal pain, shortness of breath.    ROS: Pertinent positives were reviewed as per the HPI above. All other systems were reviewed and are negative.  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  05/12/2021: Migraine with aura and without status migrainosus, not   intractable  11/21/2019: Pterygium of left eye  10/05/2020: Squamous  blepharitis of upper and lower eyelids of both   eyes  Patient Active Problem List:     Headache disorder     Other and unspecified hyperlipidemia     Allergic rhinitis, cause unspecified     Family history of stroke (cerebrovascular)     Sciatica     Esophageal reflux     Irritable bowel syndrome (IBS)     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     Squamous blepharitis of upper and lower eyelids of both eyes     Migraine with aura and without status migrainosus, not intractable    Past Surgical History: Past Surgical History:  11/2003: EXCISION/TRANSPOSITION PTERYGIUM W/O GRAFT; Right      Comment:  s/p excision of pterygium OD by [facility]JP II  12/17/2019: PTERYGIUM EXCISION W/ GRAFT; Left      Comment:  s/p pterygium excision, with mitomycin C and amniograft                OS by [facility]JP II  Social History:   Social History     Socioeconomic History    Marital status: Single     Spouse name: Not on file    Number of children: Not on file    Years of education: Not on file    Highest education level: Not on file   Occupational History  Occupation: Presenter, broadcasting: [name]     Comment: In [place] 2002; 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, family   Tobacco Use    Smoking status: Never    Smokeless tobacco: Never   Substance and Sexual Activity    Alcohol use: No    Drug use: No    Sexual activity: Never     Comment: not sexually active   Other Topics Concern    Not on file   Social History Narrative    Working: Investment banker, 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[employer], no children        No smoking    ETOH - once a month    No drugs        Lives with brother and sister-in-law and 2 children   Social Determinants of Health  Financial Resource Strain: Not on file  Food Insecurity: Not on file  Transportation Needs: Not on file  Physical Activity: Not on file  Stress: Not on file  Social Connections: Not on file  Intimate Partner Violence: Not on file  Housing Stability: Not on file   Allergies: Review of Patient's Allergies  indicates:  No Known Allergies  Immunizations:   Immunization History   Administered Date(s) Administered    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 Taking?   acetaminophen (TYLENOL) 325 MG tablet   No No   Sig: Take 2 tablets by mouth every 6 (six) hours as needed   carboxymethylcellulose (REFRESH TEARS) 0.5 % SOLN   No No   Sig: Place 1 drop into both eyes in the morning and 1 drop at noon and 1 drop in the evening and 1 drop before bedtime.   cyclobenzaprine (FLEXERIL) 10 MG tablet   No No   Sig: Take 1 tablet by mouth 3 (three) times daily as needed  for up to 7 days   ibuprofen (ADVIL) 600 MG tablet   No No   Sig: Take 1 tablet by mouth every 8 (eight) hours as needed for Pain pain  for up to 7 days   lidocaine (SALONPAS) 4 % PTCH   No No   Sig: Apply 1 patch topically daily as needed  for up to 7 days      Facility-Administered Medications: None       Physical Exam:  ED Triage Vitals [08/23/22 1406]   ED Triage Vitals Brief Group      Temp 97.8 F      Pulse 106      Resp 14      BP 143/92      SpO2 98 %      Pain Score 8      GENERAL:  WAWN, no acute distress, non-toxic  SKIN:  Warm & dry, no rash  HEENT:  NCAT; EOMI. Sclerae are anicteric and aninjected  LUNGS:  Clear  to auscultation bilaterally. No wheezes, rales, rhonchi  HEART:  RRR.  No murmurs, rubs, or gallops  BACK: No edema/erythema/ecchymosis/lesions. CTLS spine straight and non-tender. Tender in R lumbar paraspinal muscles. Neurovascularly intact  distally  MSK/EXTREMITIES:  No obvious deformities.  Warm and well perfused.  No cyanosis, no edema  NEUROLOGIC:  Alert, oriented; moves all extremities; speaking in sentences. Normal gait without ataxia  PSYCHIATRIC:  Appropriate for age, time of day, and situation    Medications Given in the ED:  Medications   acetaminophen (TYLENOL) tablet 1,000 mg (1,000 mg Oral Given 08/23/22 1510)   ketorolac (TORADOL) injection 15 mg (15 mg Intramuscular Given 08/23/22 1510)    Radiology Results:  None   Lab Results (abnormal results only):  Labs Reviewed - No data to display Other Results/Old Record review (e.g. ECG):  None     ED Course/Medical Decision Making:  Patient's past medical history/problem list, past surgical history, medication list, social history and allergies reviewed. Pertinent labs & imaging studies reviewed.  Prior records reviewed. Nursing notes reviewed.    Jonathan [name] is a 50 year old male who presented at 66.    Patient is well appearing, in no acute distress on presentation. Vitals reassuring. Physical exam with back tenderness limited to R lumbar paraspinal muscles. Tylenol, toradol provided.    Records from 12/3 visit reviewed, XR R ribs/chest, L spine, pelvis without acute fracture or dislocation. No new injury or indication otherwise for additional imaging.    Clinical picture remains suggestive of MSK etiology, ddx contusion vs strain. No fevers, IVDU history to suggest epidural abscess. No saddle anesthesia, incontinence, urinary retention, motor weakness to suggest cauda equina, cord compression.    Reassurance provided, expected improvement over time discussed. Adherence to medication schedule encouraged. Stable for outpatient follow up.    Patient Vitals for the past 999 hrs:   Temp Pulse Resp BP SpO2   08/23/22 1406 97.8 F 106 14 143/92 98 %     Patient remained hemodynamically stable while in the emergency department.    Patient/parent/guardian has been provided with precautions to return  to the ED for new or worsening symptoms, or if unable to obtain follow up care.     Stable for discharge with PCP follow up.  PCP: Lavonna Rua, MD    Disposition: Discharge   Condition: Stable    Diagnosis/Diagnoses:   Acute right-sided low back pain without sciatica    Prescribed Medications from this Visit:  Discharge Medication List as of 08/23/2022  3:01 PM    START taking these medications    !! ibuprofen (ADVIL) 600 MG tablet  Take 1 tablet by mouth every 8 (eight) hours as needed for Pain or Fever pain  Tamperproof, Disp-20 tablet, R-0    !! acetaminophen (TYLENOL) 500 MG tablet  Take 1 tablet by mouth every 8 (eight) hours as needed for Pain  for up to 7 days  Tamperproof, Disp-20 tablet, R-0    !! - Potential duplicate medications found. Please discuss with provider.           Dianna Limbo, PA-C, 09/02/2022 4:40 PM  This Emergency Department patient encounter note was created using voice-recognition software and in real time during the ED visit

## 2022-12-06 IMAGING — MR RM DO ENCEFALO
16 of 18 series · 18 of 48 positions shown · non-contrast
Comparison: none

[Series 1: loc airx · axial · 15.0mm · 1.17mm/px · 1 of 27 slices shown]
[im 1/27]
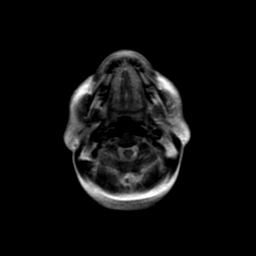

[Series 2: T1 · sagittal · 5.0mm · 0.47mm/px · 1 of 20 slices shown (1 of 4)]
[im 1/20]
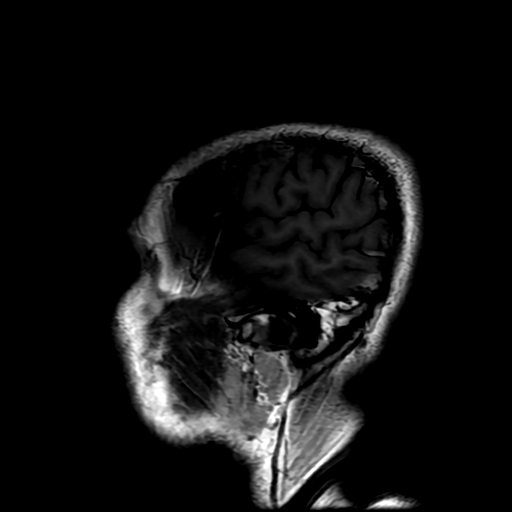

[Series 3: FLAIR · axial · 5.5mm · 0.49mm/px · 1 of 20 slices shown]
[im 1/20]
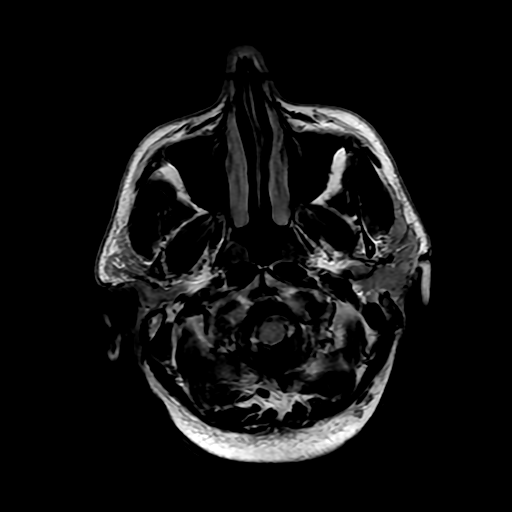

[Series 4: T2 · axial · 5.5mm · 0.49mm/px · 1 of 20 slices shown (1 of 3)]
[im 1/20]
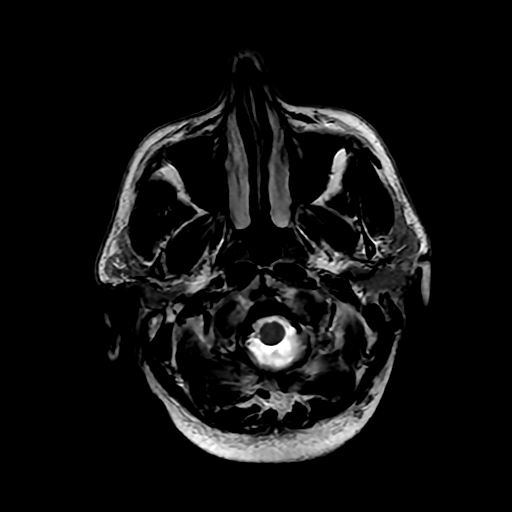

[Series 5: T2-star · axial · 5.5mm · 0.49mm/px · 1 of 20 slices shown (1 of 2)]
[im 1/20]
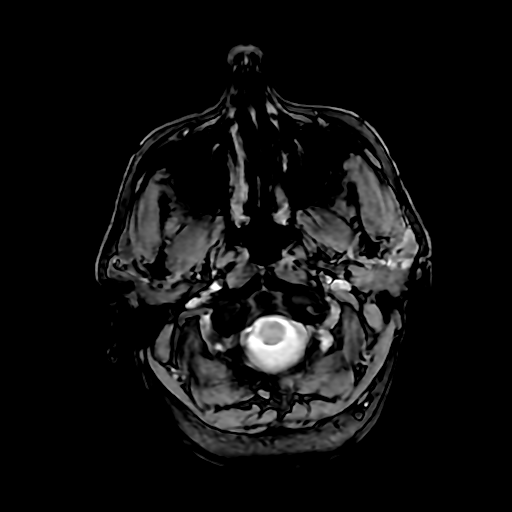

[Series 6: T2 · coronal · 3.0mm · 0.31mm/px · 1 of 20 slices shown (2 of 3)]
[im 1/20]
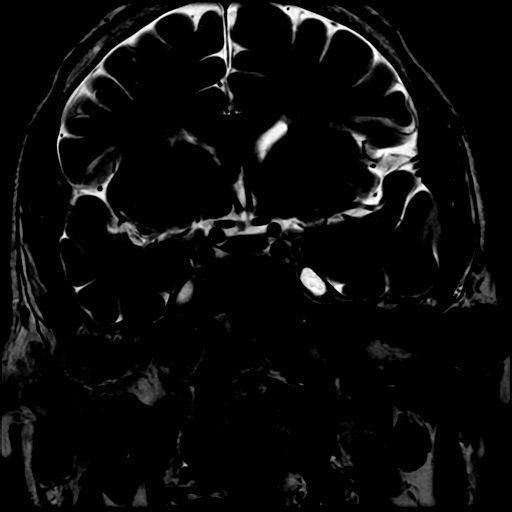

[Series 7: T1 · axial · 5.5mm · 0.49mm/px · 1 of 20 slices shown (2 of 4)]
[im 1/20]
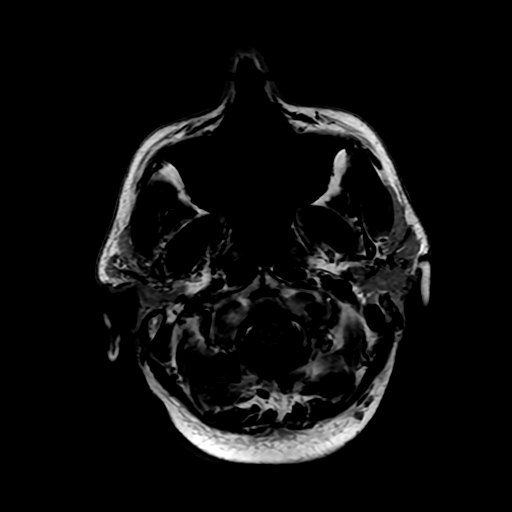

[Series 9: T2 · coronal · 5.5mm · 0.49mm/px · 1 of 24 slices shown (3 of 3)]
[im 1/24]
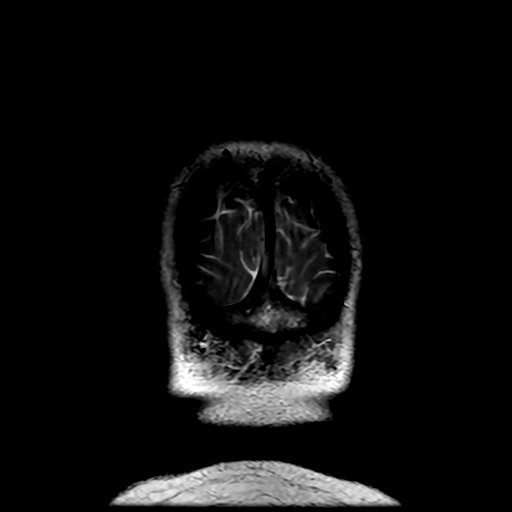

[Series 10: DWI · axial · 5.0mm · 0.98mm/px · z∈[-8,+112]mm · 2 of 44 slices shown]
[im 1/44]
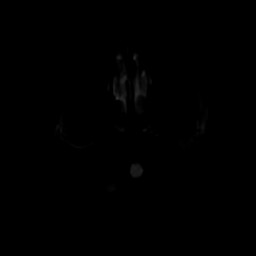
[im 44/44]
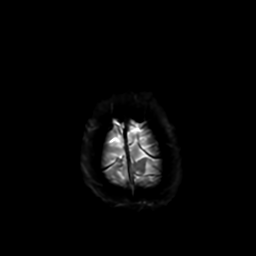

[Series 12: T1 · axial · 5.5mm · 0.49mm/px · 1 of 20 slices shown (3 of 4)]
[im 1/20]
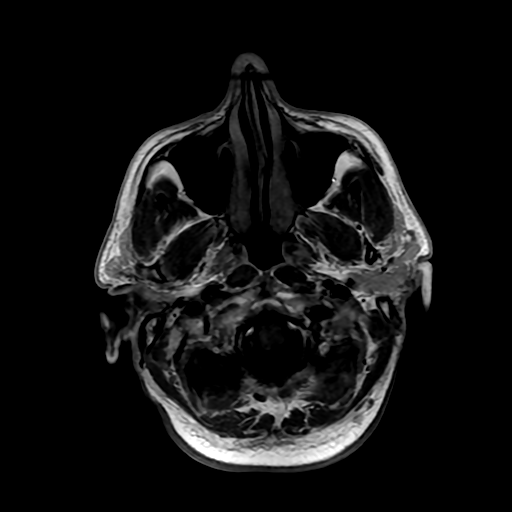

[Series 1050: ADC · axial · 5.0mm · 0.98mm/px · 1 of 22 slices shown (1 of 2)]
[im 1/22]
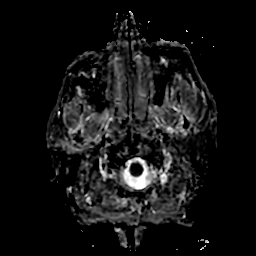

[Series 1051: eadc · axial · 5.0mm · 0.98mm/px · 1 of 22 slices shown (1 of 2)]
[im 1/22]
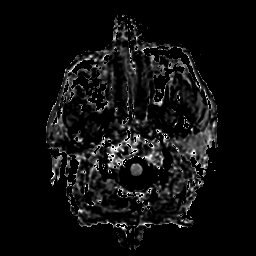

[Series 1052: ADC · axial · 5.0mm · 0.98mm/px · 1 of 22 slices shown (2 of 2)]
[im 1/22]
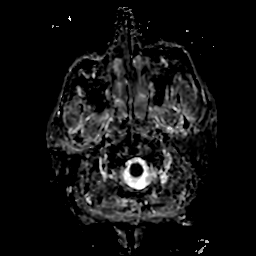

[Series 1053: eadc · axial · 5.0mm · 0.98mm/px · 1 of 22 slices shown (2 of 2)]
[im 1/22]
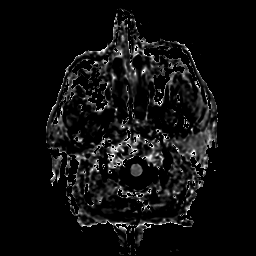

[Series 1100: T1 · axial · 5.0mm · 0.50mm/px · z∈[-122,+135]mm · 2 of 44 slices shown (4 of 4)]
[im 1/44]
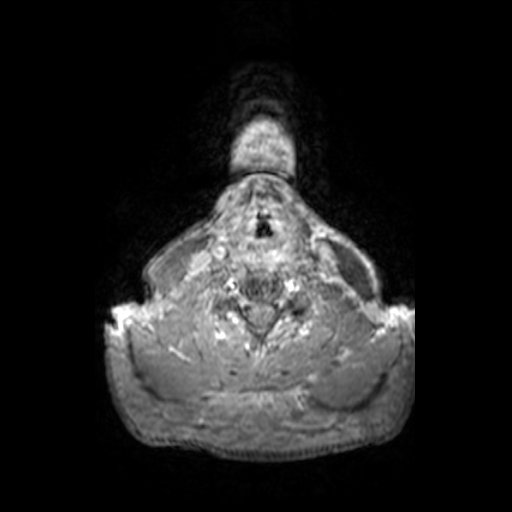
[im 44/44]
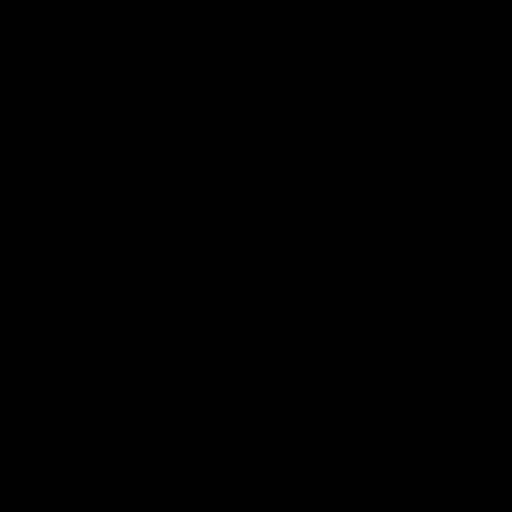

[T2-star · axial · 5.5mm · 0.49mm/px · 1 of 20 slices shown (2 of 2)]
[im 1/20]
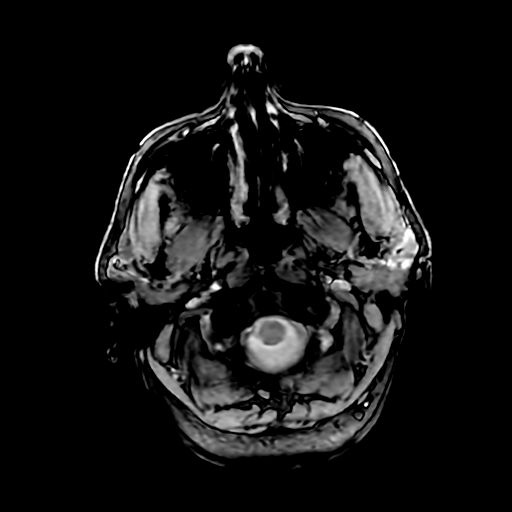

[18 of 48 positions shown; findings below may reference images not displayed]

METODOLOGIA:
Exame realizado com sequências SE (spin-echo), FSE (fast spin-eco), GR (gradiente-eco) e FSE-IR (FLAIR),
em planos de cortes múltiplos, antes e após a administração intravenosa do agente de contraste
paramagnético.
ANÁLISE:
Não há evidência de processo expansivo intracraniano, hemorragia intraparenquimatosa aguda, isquemia
aguda/subaguda, bem como de coleções líquidas extra-axiais acima ou abaixo do tentório.
RESSONÂNCIA MAGNÉTICA DO ENCÉFALO
O sistema ventricular é de topografia, morfologia e dimensões normais.
Focos de hipersinal em T2 distribuídos pela substância branca supratentorial, sem determinar efeito atrófico
ou expansivo significativo, sugerindo gliose por microangiopatia.
Não se evidenciam áreas com impregnação anômala pelo gadolínio.
Não se identificam áreas com restrição à difusão da água na sequência ECOPLANAR.
Fluxo habitual nas grandes artérias dos sistemas vertebrobasilar e carotídeo, segundo o critério Spin-Echo.
Cavidades aéreas paranasais com intensidade de sinal normal.
IMPRESSÃO:
Focos de hipersinal em T2 distribuídos pela substância branca supratentorial, sem determinar efeito atrófico
ou expansivo significativo, sugerindo gliose por microangiopatia.
Não se evidenciam áreas com impregnação anômala pelo gadolínio.
